# Patient Record
Sex: Female | Born: 1950 | Race: White | Hispanic: No | Marital: Single | Smoking: Former smoker
Health system: Southern US, Community
[De-identification: ages and names within clinical notes are randomized; demographics above are authoritative.]

## PROBLEM LIST (undated history)

## (undated) DIAGNOSIS — G43909 Migraine, unspecified, not intractable, without status migrainosus: Secondary | ICD-10-CM

## (undated) DIAGNOSIS — R519 Headache, unspecified: Secondary | ICD-10-CM

## (undated) DIAGNOSIS — E119 Type 2 diabetes mellitus without complications: Secondary | ICD-10-CM

## (undated) DIAGNOSIS — Z9889 Other specified postprocedural states: Secondary | ICD-10-CM

## (undated) DIAGNOSIS — R112 Nausea with vomiting, unspecified: Secondary | ICD-10-CM

## (undated) DIAGNOSIS — R011 Cardiac murmur, unspecified: Secondary | ICD-10-CM

## (undated) DIAGNOSIS — R0609 Other forms of dyspnea: Secondary | ICD-10-CM

## (undated) DIAGNOSIS — I77810 Thoracic aortic ectasia: Secondary | ICD-10-CM

## (undated) DIAGNOSIS — K219 Gastro-esophageal reflux disease without esophagitis: Secondary | ICD-10-CM

## (undated) DIAGNOSIS — F32A Depression, unspecified: Secondary | ICD-10-CM

## (undated) DIAGNOSIS — K2270 Barrett's esophagus without dysplasia: Secondary | ICD-10-CM

## (undated) DIAGNOSIS — G4733 Obstructive sleep apnea (adult) (pediatric): Secondary | ICD-10-CM

## (undated) DIAGNOSIS — E785 Hyperlipidemia, unspecified: Secondary | ICD-10-CM

## (undated) DIAGNOSIS — K259 Gastric ulcer, unspecified as acute or chronic, without hemorrhage or perforation: Secondary | ICD-10-CM

## (undated) DIAGNOSIS — R51 Headache: Secondary | ICD-10-CM

## (undated) DIAGNOSIS — R0600 Dyspnea, unspecified: Secondary | ICD-10-CM

## (undated) DIAGNOSIS — M1712 Unilateral primary osteoarthritis, left knee: Secondary | ICD-10-CM

## (undated) DIAGNOSIS — I119 Hypertensive heart disease without heart failure: Secondary | ICD-10-CM

## (undated) DIAGNOSIS — I10 Essential (primary) hypertension: Secondary | ICD-10-CM

## (undated) DIAGNOSIS — E669 Obesity, unspecified: Secondary | ICD-10-CM

## (undated) HISTORY — PX: KNEE ARTHROSCOPY: SHX127

## (undated) HISTORY — PX: APPENDECTOMY: SHX54

## (undated) HISTORY — PX: NASAL SINUS SURGERY: SHX719

## (undated) HISTORY — PX: CHOLECYSTECTOMY: SHX55

## (undated) HISTORY — PX: ABDOMINAL HYSTERECTOMY: SHX81

## (undated) HISTORY — PX: COLONOSCOPY: SHX174

## (undated) HISTORY — PX: HERNIA REPAIR: SHX51

## (undated) HISTORY — PX: BACK SURGERY: SHX140

---

## 2007-10-31 ENCOUNTER — Ambulatory Visit: Payer: Self-pay

## 2008-11-25 ENCOUNTER — Ambulatory Visit: Payer: Self-pay | Admitting: Unknown Physician Specialty

## 2008-11-30 ENCOUNTER — Ambulatory Visit: Payer: Self-pay | Admitting: Unknown Physician Specialty

## 2011-11-23 ENCOUNTER — Ambulatory Visit: Payer: Self-pay | Admitting: Family Medicine

## 2014-06-06 ENCOUNTER — Ambulatory Visit
Admit: 2014-06-06 | Disposition: A | Discharge status: Home / Self Care | Payer: Self-pay | Attending: Surgery | Admitting: Surgery

## 2014-06-29 ENCOUNTER — Encounter
Admission: RE | Admit: 2014-06-29 | Discharge: 2014-06-29 | Disposition: A | Discharge status: Home / Self Care | Payer: Medicare HMO | Source: Ambulatory Visit | Attending: Family Medicine | Admitting: Family Medicine

## 2014-06-29 ENCOUNTER — Other Ambulatory Visit: Payer: Self-pay

## 2014-06-29 DIAGNOSIS — Z79899 Other long term (current) drug therapy: Secondary | ICD-10-CM | POA: Diagnosis not present

## 2014-06-29 DIAGNOSIS — Z91013 Allergy to seafood: Secondary | ICD-10-CM | POA: Diagnosis not present

## 2014-06-29 DIAGNOSIS — Z9049 Acquired absence of other specified parts of digestive tract: Secondary | ICD-10-CM | POA: Diagnosis not present

## 2014-06-29 DIAGNOSIS — K219 Gastro-esophageal reflux disease without esophagitis: Secondary | ICD-10-CM | POA: Diagnosis not present

## 2014-06-29 DIAGNOSIS — M2391 Unspecified internal derangement of right knee: Secondary | ICD-10-CM | POA: Diagnosis not present

## 2014-06-29 DIAGNOSIS — I10 Essential (primary) hypertension: Secondary | ICD-10-CM | POA: Diagnosis not present

## 2014-06-29 DIAGNOSIS — M1711 Unilateral primary osteoarthritis, right knee: Secondary | ICD-10-CM | POA: Diagnosis not present

## 2014-06-29 DIAGNOSIS — Z803 Family history of malignant neoplasm of breast: Secondary | ICD-10-CM | POA: Diagnosis not present

## 2014-06-29 DIAGNOSIS — M179 Osteoarthritis of knee, unspecified: Secondary | ICD-10-CM | POA: Diagnosis not present

## 2014-06-29 DIAGNOSIS — Z833 Family history of diabetes mellitus: Secondary | ICD-10-CM | POA: Diagnosis not present

## 2014-06-29 DIAGNOSIS — G43909 Migraine, unspecified, not intractable, without status migrainosus: Secondary | ICD-10-CM | POA: Diagnosis not present

## 2014-06-29 DIAGNOSIS — Z01812 Encounter for preprocedural laboratory examination: Secondary | ICD-10-CM | POA: Diagnosis not present

## 2014-06-29 DIAGNOSIS — Z9889 Other specified postprocedural states: Secondary | ICD-10-CM | POA: Diagnosis not present

## 2014-06-29 DIAGNOSIS — E119 Type 2 diabetes mellitus without complications: Secondary | ICD-10-CM | POA: Diagnosis not present

## 2014-06-29 DIAGNOSIS — Z808 Family history of malignant neoplasm of other organs or systems: Secondary | ICD-10-CM | POA: Diagnosis not present

## 2014-06-29 DIAGNOSIS — Z87891 Personal history of nicotine dependence: Secondary | ICD-10-CM | POA: Diagnosis not present

## 2014-06-29 DIAGNOSIS — R011 Cardiac murmur, unspecified: Secondary | ICD-10-CM | POA: Diagnosis not present

## 2014-06-29 DIAGNOSIS — Z885 Allergy status to narcotic agent status: Secondary | ICD-10-CM | POA: Diagnosis not present

## 2014-06-29 DIAGNOSIS — Z882 Allergy status to sulfonamides status: Secondary | ICD-10-CM | POA: Diagnosis not present

## 2014-06-29 DIAGNOSIS — G8929 Other chronic pain: Secondary | ICD-10-CM | POA: Diagnosis not present

## 2014-06-29 DIAGNOSIS — Z886 Allergy status to analgesic agent status: Secondary | ICD-10-CM | POA: Diagnosis not present

## 2014-06-29 HISTORY — DX: Headache, unspecified: R51.9

## 2014-06-29 HISTORY — DX: Other specified postprocedural states: Z98.890

## 2014-06-29 HISTORY — DX: Headache: R51

## 2014-06-29 HISTORY — DX: Gastro-esophageal reflux disease without esophagitis: K21.9

## 2014-06-29 HISTORY — DX: Essential (primary) hypertension: I10

## 2014-06-29 HISTORY — DX: Gastric ulcer, unspecified as acute or chronic, without hemorrhage or perforation: K25.9

## 2014-06-29 HISTORY — DX: Type 2 diabetes mellitus without complications: E11.9

## 2014-06-29 HISTORY — DX: Nausea with vomiting, unspecified: R11.2

## 2014-06-29 LAB — BASIC METABOLIC PANEL
Anion gap: 7 (ref 5–15)
BUN: 11 mg/dL (ref 6–20)
CO2: 22 mmol/L (ref 22–32)
Calcium: 9.2 mg/dL (ref 8.9–10.3)
Chloride: 112 mmol/L — ABNORMAL HIGH (ref 101–111)
Creatinine, Ser: 0.96 mg/dL (ref 0.44–1.00)
GFR calc Af Amer: >60 mL/min (ref >60)
Glucose, Bld: 137 mg/dL — ABNORMAL HIGH (ref 65–99)
Potassium: 4.2 mmol/L (ref 3.5–5.1)
Sodium: 141 mmol/L (ref 135–145)

## 2014-06-29 NOTE — Pre-Procedure Instructions (Signed)
Cindy Singleton  06/29/2014   Your procedure is scheduled on:  07/07/14  Report to Same Day Surgery, [facility] Entrance   Call this number [phone] the day before surgery to get your arrival time for 07/07/14   Remember:   Do not eat food or drink liquids after midnight.   Take these medicines the morning of surgery with A SIP OF WATER: carvedilol, omeprazole, cetirizine. Hold your metformin for 2 days prior to surgery   Do not wear jewelry, make-up or nail polish.  Do not wear lotions, powders, or perfumes. You may wear deodorant.  Do not shave 48 hours prior to surgery. Men may shave face and neck.  Do not bring valuables to the hospital.  [facility] not [facility]                  for any belongings or valuables.               Contacts, dentures or bridgework may not be worn into surgery.  Leave suitcase in the car. After surgery it may be brought to your room.  For patients admitted to the hospital, discharge time is determined by your                treatment team.               Patients discharged the day of surgery will not be allowed to drive  home.  Name and phone number of your driver:  Special Instructions: Shower using CHG 2 nights before surgery and the night before surgery.  If you shower the day of surgery use CHG.  Use special wash - you have one bottle of CHG for all showers.  You should use approximately 1/3 of the bottle for each shower.   Please read over the following fact sheets that you were given: Surgical Site Infection Prevention

## 2014-07-06 MED ORDER — DEXTROSE 5 % IV SOLN
3.0000 g | INTRAVENOUS | Status: AC
  Administered 2014-07-07: 3 g via INTRAVENOUS
  Filled 2014-07-06 (×2): qty 3000

## 2014-07-07 ENCOUNTER — Ambulatory Visit: Payer: Medicare HMO | Admitting: Certified Registered Nurse Anesthetist

## 2014-07-07 ENCOUNTER — Encounter
Admission: RE | Disposition: A | Discharge status: Home / Self Care | Payer: Self-pay | Source: Ambulatory Visit | Attending: Surgery

## 2014-07-07 ENCOUNTER — Ambulatory Visit
Admission: RE | Admit: 2014-07-07 | Discharge: 2014-07-07 | Disposition: A | Discharge status: Home / Self Care | Payer: Medicare HMO | Source: Ambulatory Visit | Attending: Surgery | Admitting: Surgery

## 2014-07-07 ENCOUNTER — Encounter: Payer: Self-pay | Admitting: Anesthesiology

## 2014-07-07 DIAGNOSIS — Z885 Allergy status to narcotic agent status: Secondary | ICD-10-CM | POA: Insufficient documentation

## 2014-07-07 DIAGNOSIS — Z833 Family history of diabetes mellitus: Secondary | ICD-10-CM | POA: Insufficient documentation

## 2014-07-07 DIAGNOSIS — Z882 Allergy status to sulfonamides status: Secondary | ICD-10-CM | POA: Insufficient documentation

## 2014-07-07 DIAGNOSIS — M1711 Unilateral primary osteoarthritis, right knee: Secondary | ICD-10-CM | POA: Insufficient documentation

## 2014-07-07 DIAGNOSIS — Z808 Family history of malignant neoplasm of other organs or systems: Secondary | ICD-10-CM | POA: Insufficient documentation

## 2014-07-07 DIAGNOSIS — G8929 Other chronic pain: Secondary | ICD-10-CM | POA: Insufficient documentation

## 2014-07-07 DIAGNOSIS — Z87891 Personal history of nicotine dependence: Secondary | ICD-10-CM | POA: Insufficient documentation

## 2014-07-07 DIAGNOSIS — I10 Essential (primary) hypertension: Secondary | ICD-10-CM | POA: Insufficient documentation

## 2014-07-07 DIAGNOSIS — M2391 Unspecified internal derangement of right knee: Secondary | ICD-10-CM | POA: Insufficient documentation

## 2014-07-07 DIAGNOSIS — Z9049 Acquired absence of other specified parts of digestive tract: Secondary | ICD-10-CM | POA: Insufficient documentation

## 2014-07-07 DIAGNOSIS — Z886 Allergy status to analgesic agent status: Secondary | ICD-10-CM | POA: Insufficient documentation

## 2014-07-07 DIAGNOSIS — K219 Gastro-esophageal reflux disease without esophagitis: Secondary | ICD-10-CM | POA: Insufficient documentation

## 2014-07-07 DIAGNOSIS — R011 Cardiac murmur, unspecified: Secondary | ICD-10-CM | POA: Insufficient documentation

## 2014-07-07 DIAGNOSIS — Z79899 Other long term (current) drug therapy: Secondary | ICD-10-CM | POA: Insufficient documentation

## 2014-07-07 DIAGNOSIS — Z9889 Other specified postprocedural states: Secondary | ICD-10-CM | POA: Insufficient documentation

## 2014-07-07 DIAGNOSIS — G43909 Migraine, unspecified, not intractable, without status migrainosus: Secondary | ICD-10-CM | POA: Insufficient documentation

## 2014-07-07 DIAGNOSIS — E119 Type 2 diabetes mellitus without complications: Secondary | ICD-10-CM | POA: Insufficient documentation

## 2014-07-07 DIAGNOSIS — Z91013 Allergy to seafood: Secondary | ICD-10-CM | POA: Insufficient documentation

## 2014-07-07 DIAGNOSIS — Z803 Family history of malignant neoplasm of breast: Secondary | ICD-10-CM | POA: Insufficient documentation

## 2014-07-07 HISTORY — PX: KNEE ARTHROSCOPY WITH MEDIAL MENISECTOMY: SHX5651

## 2014-07-07 LAB — GLUCOSE, CAPILLARY
Glucose-Capillary: 158 mg/dL — ABNORMAL HIGH (ref 70–99)
Glucose-Capillary: 150 mg/dL — ABNORMAL HIGH (ref 70–99)

## 2014-07-07 SURGERY — ARTHROSCOPY, KNEE, WITH MEDIAL MENISCECTOMY
Anesthesia: General | Laterality: Right

## 2014-07-07 MED ORDER — LIDOCAINE HCL 1 % IJ SOLN
INTRAMUSCULAR | Status: DC | PRN
  Administered 2014-07-07: 30 mL via INTRADERMAL

## 2014-07-07 MED ORDER — BUPIVACAINE-EPINEPHRINE (PF) 0.5% -1:200000 IJ SOLN
INTRAMUSCULAR | Status: AC
  Filled 2014-07-07: qty 30

## 2014-07-07 MED ORDER — ONDANSETRON HCL 4 MG/2ML IJ SOLN
INTRAMUSCULAR | Status: DC | PRN
  Administered 2014-07-07: 4 mg via INTRAVENOUS

## 2014-07-07 MED ORDER — LIDOCAINE HCL (CARDIAC) 20 MG/ML IV SOLN
INTRAVENOUS | Status: DC | PRN
  Administered 2014-07-07: 100 mg via INTRAVENOUS

## 2014-07-07 MED ORDER — BUPIVACAINE-EPINEPHRINE (PF) 0.5% -1:200000 IJ SOLN
INTRAMUSCULAR | Status: DC | PRN
  Administered 2014-07-07: 30 mL
  Administered 2014-07-07: 14 mL

## 2014-07-07 MED ORDER — SUCCINYLCHOLINE CHLORIDE 20 MG/ML IJ SOLN
INTRAMUSCULAR | Status: DC | PRN
  Administered 2014-07-07: 120 mg via INTRAVENOUS

## 2014-07-07 MED ORDER — ONDANSETRON HCL 4 MG/2ML IJ SOLN
4.0000 mg | Freq: Once | INTRAMUSCULAR | Status: AC | PRN
  Administered 2014-07-07: 4 mg via INTRAVENOUS

## 2014-07-07 MED ORDER — ONDANSETRON HCL 4 MG/2ML IJ SOLN
INTRAMUSCULAR | Status: AC
  Administered 2014-07-07: 4 mg via INTRAVENOUS
  Filled 2014-07-07: qty 2

## 2014-07-07 MED ORDER — LIDOCAINE HCL (PF) 1 % IJ SOLN
INTRAMUSCULAR | Status: AC
  Filled 2014-07-07: qty 30

## 2014-07-07 MED ORDER — FENTANYL CITRATE (PF) 100 MCG/2ML IJ SOLN: 25.0000 ug | INTRAMUSCULAR | Status: DC | PRN

## 2014-07-07 MED ORDER — ACETAMINOPHEN 10 MG/ML IV SOLN
INTRAVENOUS | Status: AC
  Administered 2014-07-07: 1000 mg via INTRAVENOUS
  Filled 2014-07-07: qty 100

## 2014-07-07 MED ORDER — MIDAZOLAM HCL 2 MG/2ML IJ SOLN
INTRAMUSCULAR | Status: DC | PRN
  Administered 2014-07-07: 2 mg via INTRAVENOUS

## 2014-07-07 MED ORDER — FENTANYL CITRATE (PF) 100 MCG/2ML IJ SOLN
INTRAMUSCULAR | Status: DC | PRN
  Administered 2014-07-07 (×2): 50 ug via INTRAVENOUS

## 2014-07-07 MED ORDER — SODIUM CHLORIDE 0.9 % IV SOLN
INTRAVENOUS | Status: DC
  Administered 2014-07-07 (×2): via INTRAVENOUS

## 2014-07-07 MED ORDER — CHLORHEXIDINE GLUCONATE 4 % EX LIQD
60.0000 mL | Freq: Once | CUTANEOUS | Status: AC
  Administered 2014-07-07: 4 via TOPICAL

## 2014-07-07 MED ORDER — PROPOFOL 10 MG/ML IV BOLUS
INTRAVENOUS | Status: DC | PRN
  Administered 2014-07-07: 160 mg via INTRAVENOUS

## 2014-07-07 SURGICAL SUPPLY — 28 items
BAG COUNTER SPONGE EZ (MISCELLANEOUS) IMPLANT
BANDAGE ELASTIC 6 CLIP ST LF (GAUZE/BANDAGES/DRESSINGS) ×3 IMPLANT
BLADE FULL RADIUS 3.5 (BLADE) ×3 IMPLANT
BLADE SHAVER 4.5X7 STR FR (MISCELLANEOUS) ×6 IMPLANT
CHLORAPREP W/TINT 26ML (MISCELLANEOUS) ×3 IMPLANT
COUNTER SPONGE BAG EZ (MISCELLANEOUS)
GAUZE SPONGE 4X4 12PLY STRL (GAUZE/BANDAGES/DRESSINGS) ×3 IMPLANT
GLOVE BIO SURGEON STRL SZ8 (GLOVE) ×6 IMPLANT
GLOVE BIOGEL M 7.0 STRL (GLOVE) ×6 IMPLANT
GLOVE BIOGEL PI IND STRL 7.5 (GLOVE) ×1 IMPLANT
GLOVE BIOGEL PI INDICATOR 7.5 (GLOVE) ×2
GLOVE INDICATOR 8.0 STRL GRN (GLOVE) ×3 IMPLANT
GOWN STRL REUS W/ TWL LRG LVL3 (GOWN DISPOSABLE) ×1 IMPLANT
GOWN STRL REUS W/ TWL XL LVL3 (GOWN DISPOSABLE) ×2 IMPLANT
GOWN STRL REUS W/TWL LRG LVL3 (GOWN DISPOSABLE) ×2
GOWN STRL REUS W/TWL XL LVL3 (GOWN DISPOSABLE) ×4
IV LACTATED RINGER IRRG 3000ML (IV SOLUTION) ×2
IV LR IRRIG 3000ML ARTHROMATIC (IV SOLUTION) ×1 IMPLANT
KIT RM TURNOVER STRD PROC AR (KITS) ×3 IMPLANT
MANIFOLD NEPTUNE II (INSTRUMENTS) ×3 IMPLANT
NEEDLE HYPO 21X1.5 SAFETY (NEEDLE) ×3 IMPLANT
PACK ARTHROSCOPY KNEE (MISCELLANEOUS) ×3 IMPLANT
PAD CAST CTTN 4X4 STRL (SOFTGOODS) ×1 IMPLANT
PADDING CAST COTTON 4X4 STRL (SOFTGOODS) ×2
SUT PROLENE 4 0 PS 2 18 (SUTURE) ×3 IMPLANT
SYR 50ML LL SCALE MARK (SYRINGE) ×3 IMPLANT
TUBING ARTHRO INFLOW-ONLY STRL (TUBING) ×3 IMPLANT
WAND HAND CNTRL MULTIVAC 90 (MISCELLANEOUS) ×3 IMPLANT

## 2014-07-07 NOTE — Anesthesia Procedure Notes (Signed)
Procedure Name: Intubation Performed by: Malva CoganBEANE, Shandi Godfrey Patient Re-evaluated:Patient Re-evaluated prior to inductionOxygen Delivery Method: Circle system utilized Preoxygenation: Pre-oxygenation with 100% oxygen Intubation Type: IV induction, Rapid sequence and Cricoid Pressure applied Ventilation: Mask ventilation without difficulty Laryngoscope Size: Mac and 3 Grade View: Grade I Tube type: Oral Number of attempts: 1 Placement Confirmation: ETT inserted through vocal cords under direct vision,  positive ETCO2 and breath sounds checked- equal and bilateral Secured at: 21 cm Tube secured with: Tape Dental Injury: Teeth and Oropharynx as per pre-operative assessment

## 2014-07-07 NOTE — Anesthesia Preprocedure Evaluation (Deleted)
Anesthesia Evaluation    Airway        Dental   Pulmonary former smoker,           Cardiovascular hypertension,      Neuro/Psych    GI/Hepatic   Endo/Other  diabetes  Renal/GU      Musculoskeletal   Abdominal   Peds  Hematology   Anesthesia Other Findings   Reproductive/Obstetrics                             Anesthesia Physical Anesthesia Plan Anesthesia Quick Evaluation  

## 2014-07-07 NOTE — Anesthesia Preprocedure Evaluation (Addendum)
Anesthesia Evaluation  Patient identified by MRN, date of birth, ID band Patient awake    Reviewed: Allergy & Precautions, NPO status , Patient's Chart, lab work & pertinent test results, reviewed documented beta blocker date and time   History of Anesthesia Complications (+) history of anesthetic complications  Airway Mallampati: II   Neck ROM: Full    Dental  (+) Dental Advisory Given, Partial Upper   Pulmonary former smoker,  breath sounds clear to auscultation  Pulmonary exam normal       Cardiovascular hypertension, Rhythm:Regular Rate:Normal     Neuro/Psych  Headaches,    GI/Hepatic PUD, GERD-  Controlled and Medicated,  Endo/Other  diabetes, Well Controlled, Type 2  Renal/GU   negative genitourinary   Musculoskeletal negative musculoskeletal ROS (+)   Abdominal Normal abdominal exam  (+) + obese,   Peds negative pediatric ROS (+)  Hematology   Anesthesia Other Findings   Reproductive/Obstetrics negative OB ROS                            Anesthesia Physical Anesthesia Plan  ASA: III  Anesthesia Plan: General   Post-op Pain Management:    Induction: Intravenous and Rapid sequence  Airway Management Planned: Oral ETT  Additional Equipment:   Intra-op Plan:   Post-operative Plan: Extubation in OR  Informed Consent: I have reviewed the patients History and Physical, chart, labs and discussed the procedure including the risks, benefits and alternatives for the proposed anesthesia with the patient or authorized representative who has indicated his/her understanding and acceptance.   Dental advisory given  Plan Discussed with: CRNA and Surgeon  Anesthesia Plan Comments:         Anesthesia Quick Evaluation

## 2014-07-07 NOTE — Discharge Instructions (Addendum)
Keep dressing dry and intact.  May shower after dressing changed on post-op day #4 (Saturday).  Cover staples/sutures with Band-Aids after drying off. Apply ice frequently to knee. May weight-bear as tolerated - use crutches or walker as needed. Follow-up in 10-14 days or as scheduled.Arthroscopic Procedure, Knee, Care After Refer to this sheet in the next few weeks. These discharge instructions provide you with general information on caring for yourself after you leave the hospital. Your health care provider may also give you specific instructions. Your treatment has been planned according to the most current medical practices available, but unavoidable complications sometimes occur. If you have any problems or questions after discharge, please call your health care provider. HOME CARE INSTRUCTIONS   It is normal to be sore for a couple days after surgery. See your health care provider if this seems to be getting worse rather than better.  Only take over-the-counter or prescription medicines for pain, discomfort, or fever as directed by your health care provider.  Take showers rather than baths, or as directed by your health care provider.  Change bandages (dressings) if necessary or as directed.  You may resume normal diet and activities as directed or allowed.  Avoid lifting and driving until you are directed otherwise.  Make an appointment to see your health care provider for stitches (suture) or staple removal as directed.  You may put ice on the area.  Put the ice in a plastic bag. Place a towel between your skin and the bag.  Leave the ice on for 15-20 minutes, three to four times per day for the first 2 days.  Elevate the knee above the level of your heart to reduce swelling, and avoid dangling the leg.  Do 10-15 ankle pumps (pointing your toes toward you and then away from you) two to three times daily.  If you are given compression stockings to wear after surgery, use them  for as long as your surgeon tells you (around 10-14 days).  Avoid smoking and exposure to secondhand smoke. SEEK MEDICAL CARE IF:   You have increased bleeding from your wounds.  You see redness or swelling or you have increasing pain in your wounds.  You have pus coming from your wound.  You have a fever or persistent symptoms for more than 2-3 days.  You notice a bad smell coming from the wound or dressing.  You have severe pain with any motion of your knee. SEEK IMMEDIATE MEDICAL CARE IF:   You develop a rash.  You have difficulty breathing.  You develop any reaction or side effects to medicines taken.  You develop pain in the calves or back of the knee.  You develop chest pain, shortness of breath, or difficulty breathing.  You develop numbness or tingling in the leg or foot. MAKE SURE YOU:   Understand these instructions.  Will watch your condition.  Will get help right away if you are not doing well or you get worse. Document Released: 09/02/2004 Document Revised: 02/18/2013 Document Reviewed: 07/11/2012 [facility] Patient Information 2015 [place], [place]. This information is not intended to replace advice given to you by your health care provider. Make sure you discuss any questions you have with your health care provider. General Anesthesia, Care After Refer to this sheet in the next few weeks. These instructions provide you with information on caring for yourself after your procedure. Your health care provider may also give you more specific instructions. Your treatment has been planned according to current medical  practices, but problems sometimes occur. Call your health care provider if you have any problems or questions after your procedure. WHAT TO EXPECT AFTER THE PROCEDURE After the procedure, it is typical to experience:  Sleepiness.  Nausea and vomiting. HOME CARE INSTRUCTIONS  For the first 24 hours after general anesthesia:  Have a responsible person  with you.  Do not drive a car. If you are alone, do not take public transportation.  Do not drink alcohol.  Do not take medicine that has not been prescribed by your health care provider.  Do not sign important papers or make important decisions.  You may resume a normal diet and activities as directed by your health care provider.  Change bandages (dressings) as directed.  If you have questions or problems that seem related to general anesthesia, call the hospital and ask for the anesthetist or anesthesiologist on call. SEEK MEDICAL CARE IF:  You have nausea and vomiting that continue the day after anesthesia.  You develop a rash. SEEK IMMEDIATE MEDICAL CARE IF:   You have difficulty breathing.  You have chest pain.  You have any allergic problems. Document Released: 05/22/2000 Document Revised: 02/18/2013 Document Reviewed: 08/29/2012 [facility] Patient Information 2015 [place], [place]. This information is not intended to replace advice given to you by your health care provider. Make sure you discuss any questions you have with your health care provider.

## 2014-07-07 NOTE — Anesthesia Postprocedure Evaluation (Signed)
  Anesthesia Post-op Note  Patient: Cindy CordialNancy Jo Singleton  Procedure(s) Performed: Procedure(s): KNEE ARTHROSCOPY WITH MEDIAL MENISECTOMY  Right Arthroscopic debridement and partial medial menisectomy (Right)  Anesthesia type:General  Patient location: PACU  Post pain: Pain level controlled  Post assessment: Post-op Vital signs reviewed, Patient's Cardiovascular Status Stable, Respiratory Function Stable, Patent Airway and No signs of Nausea or vomiting  Post vital signs: Reviewed and stable  Last Vitals:  Filed Vitals:   07/07/14 1339  BP: 171/83  Pulse: 68  Temp:   Resp: 14    Level of consciousness: awake, alert  and patient cooperative  Complications: No apparent anesthesia complications

## 2014-07-07 NOTE — Op Note (Signed)
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  Postoperative diagnosis:   Same  Procedure:   Arthroscopic partial medial meniscectomy and extensive arthroscopic debridement right knee.  Surgeon:   Maryagnes AmosJ. Jeffrey Poggi, M.D.  Anesthesia:   General endotracheal.  Findings:   As above. The lateral meniscus was in satisfactory condition as were the anterior and posterior cruciate ligaments. There was significant tricompartmental degenerative changes with grade 3 chondral malacia changes involving the femoral trochlea and medial femoral condyle, and grade 2 chondromalacial changes involving the medial tibial plateau, the patella, and the lateral femoral condyle.  Complications:   None.  EBL:   <5 cc.  Total fluids:   600 cc of crystalloid.  Tourniquet time:   None  Drains:   None  Closure:   4-0 Prolene interrupted sutures.  Brief clinical note:   The patient is is a significantly overweight 64 year old female with a several month history of progressively worsening medial sided right knee pain. Her symptoms have progressed despite medications, activity modification, etc. Her history and examination were consistent with moderate degenerative joint disease with a degenerative medial meniscus tear, both of which were confirmed by MRI scan. The patient presents at this time for arthroscopy, debridement, and partial lateral meniscectomy.  Procedure:   The patient was brought into the operating room and lain in the supine position. After adequate general endotracheal intubation and anesthesia was obtained, a timeout was performed to verify the appropriate side. The patient's right knee was injected sterilely using a solution of 30 cc of 1% lidocaine and 30 cc of 0.5% Sensorcaine with epinephrine. The right lower extremity was prepped with DuraPrep solution before being draped sterilely. Preoperative antibiotics were  administered. The expected portal sites were injected with 0.5% Sensorcaine without for the camera was placed in the anterolateral portal and instrumentation performed through the anteromedial portal. The knee was sequentially examined beginning in the suprapatellar pouch the progressing to the patellofemoral space, the medial gutter compartment, the notch, and finally the lateral compartment entered. The findings were as described above. Abundant reactive synovial tissues anteriorly were debrided using the full-radius resector in order to improve visualization. The degenerative medial meniscus tear was debrided back to stable margins using a combination of the straight and up-biting baskets and full-radius resector. Some probing of the remaining rim demonstrated good stability. The areas of diffuse grade 2 and 3 chondromalacia involving the medial femoral condyle, the femoral trochlea, and the lateral femoral condyle were debrided back to stable margins using a full-radius resector. An osteophyte in the notch also was debrided using the full-radius resector. The ArthroCare wand was used to help obtain hemostasis before the instruments were removed from the joint after suctioning the excess fluid. The portal sites were closed using 4-0 Prolene interrupted sutures before a sterile bulky dressing was applied to the knee. The patient was then awakened, extubated, and returned to the recovery room in satisfactory condition after tolerating the procedure well.

## 2014-07-07 NOTE — Transfer of Care (Signed)
Immediate Anesthesia Transfer of Care Note  Patient: Cindy CordialNancy Jo Singleton  Procedure(s) Performed: Procedure(s): KNEE ARTHROSCOPY WITH MEDIAL MENISECTOMY  Right Arthroscopic debridement and partial medial menisectomy (Right)  Patient Location: PACU  Anesthesia Type:General  Level of Consciousness: awake, alert  and oriented  Airway & Oxygen Therapy: Patient Spontanous Breathing and Patient connected to face mask oxygen  Post-op Assessment: Report given to RN and Post -op Vital signs reviewed and stable  Post vital signs: Reviewed and stable  Last Vitals:  Filed Vitals:   07/07/14 1326  BP: 173/93  Pulse: 70  Temp: 36.4 C  Resp: 14    Complications: No apparent anesthesia complications

## 2014-07-09 ENCOUNTER — Encounter: Payer: Self-pay | Admitting: Surgery

## 2014-11-23 ENCOUNTER — Emergency Department
Admission: EM | Admit: 2014-11-23 | Discharge: 2014-11-23 | Disposition: A | Discharge status: Home / Self Care | Payer: Medicare HMO | Attending: Emergency Medicine | Admitting: Emergency Medicine

## 2014-11-23 ENCOUNTER — Emergency Department: Payer: Medicare HMO

## 2014-11-23 DIAGNOSIS — E119 Type 2 diabetes mellitus without complications: Secondary | ICD-10-CM | POA: Insufficient documentation

## 2014-11-23 DIAGNOSIS — Z79899 Other long term (current) drug therapy: Secondary | ICD-10-CM | POA: Insufficient documentation

## 2014-11-23 DIAGNOSIS — Z87891 Personal history of nicotine dependence: Secondary | ICD-10-CM | POA: Diagnosis not present

## 2014-11-23 DIAGNOSIS — I10 Essential (primary) hypertension: Secondary | ICD-10-CM | POA: Insufficient documentation

## 2014-11-23 DIAGNOSIS — R0602 Shortness of breath: Secondary | ICD-10-CM | POA: Diagnosis present

## 2014-11-23 DIAGNOSIS — M25461 Effusion, right knee: Secondary | ICD-10-CM | POA: Insufficient documentation

## 2014-11-23 DIAGNOSIS — R0609 Other forms of dyspnea: Secondary | ICD-10-CM | POA: Diagnosis not present

## 2014-11-23 LAB — COMPREHENSIVE METABOLIC PANEL
ALBUMIN: 3.7 g/dL (ref 3.5–5.0)
ALK PHOS: 81 U/L (ref 38–126)
ALT: 21 U/L (ref 14–54)
AST: 20 U/L (ref 15–41)
Anion gap: 9 (ref 5–15)
BUN: 10 mg/dL (ref 6–20)
CALCIUM: 9.3 mg/dL (ref 8.9–10.3)
CO2: 21 mmol/L — AB (ref 22–32)
CREATININE: 0.88 mg/dL (ref 0.44–1.00)
Chloride: 110 mmol/L (ref 101–111)
GFR calc Af Amer: >60 mL/min (ref >60)
GFR calc non Af Amer: >60 mL/min (ref >60)
Glucose, Bld: 113 mg/dL — ABNORMAL HIGH (ref 65–99)
Potassium: 3.5 mmol/L (ref 3.5–5.1)
SODIUM: 140 mmol/L (ref 135–145)
Total Bilirubin: 0.5 mg/dL (ref 0.3–1.2)
Total Protein: 6.6 g/dL (ref 6.5–8.1)

## 2014-11-23 LAB — CBC
HCT: 37.4 % (ref 35.0–47.0)
HEMOGLOBIN: 12.4 g/dL (ref 12.0–16.0)
MCH: 28.5 pg (ref 26.0–34.0)
MCHC: 33.1 g/dL (ref 32.0–36.0)
MCV: 86 fL (ref 80.0–100.0)
Platelets: 280 10*3/uL (ref 150–440)
RBC: 4.34 MIL/uL (ref 3.80–5.20)
RDW: 14.4 % (ref 11.5–14.5)
WBC: 10.1 10*3/uL (ref 3.6–11.0)

## 2014-11-23 LAB — TROPONIN I: Troponin I: <0.03 ng/mL (ref <0.031)

## 2014-11-23 LAB — FIBRIN DERIVATIVES D-DIMER (ARMC ONLY): Fibrin derivatives D-dimer (ARMC): 517 — ABNORMAL HIGH (ref 0–499)

## 2014-11-23 NOTE — ED Notes (Signed)

## 2014-11-23 NOTE — ED Provider Notes (Signed)
Time Seen: Approximately 1920  I have reviewed the triage notes  Chief Complaint: Shortness of Breath   History of Present Illness: Cindy Singleton is a 64 y.o. female who states that she's been having shortness of breath and some chest pressure with ambulation now for "" couple of years "". Her family members with her states it has seemingly been worse recently. Patient mentioned this when she was at a routine visit with her primary physician today. She apparently had some hypertension at the location and was referred to the emergency department by ems for evaluation. Patient denies any shortness of breath or chest pressure at this time. She states she's only noticed it not with activity. She has noticed some mild increased swelling in both of her feet. She denies any calf tenderness or persistent shortness of breath. She denies any nausea, vomiting, fever or chills or productive cough or any other concerns.   Past Medical History  Diagnosis Date  . PONV (postoperative nausea and vomiting)   . Hypertension   . Diabetes mellitus without complication   . GERD (gastroesophageal reflux disease)   . Headache     migraines  . Stomach ulcer     There are no active problems to display for this patient.   Past Surgical History  Procedure Laterality Date  . Appendectomy    . Abdominal hysterectomy    . Back surgery    . Hernia repair    . Cholecystectomy    . Knee arthroscopy with medial menisectomy Right 07/07/2014    Procedure: KNEE ARTHROSCOPY WITH MEDIAL MENISECTOMY  Right Arthroscopic debridement and partial medial menisectomy;  Surgeon: Christena Flake, MD;  Location: [facility] ORS;  Service: Orthopedics;  Laterality: Right;    Past Surgical History  Procedure Laterality Date  . Appendectomy    . Abdominal hysterectomy    . Back surgery    . Hernia repair    . Cholecystectomy    . Knee arthroscopy with medial menisectomy Right 07/07/2014    Procedure: KNEE ARTHROSCOPY WITH MEDIAL  MENISECTOMY  Right Arthroscopic debridement and partial medial menisectomy;  Surgeon: Christena Flake, MD;  Location: [facility] ORS;  Service: Orthopedics;  Laterality: Right;    Current Outpatient Rx  Name  Route  Sig  Dispense  Refill  . acetaminophen (TYLENOL) 325 MG tablet   Oral   Take 650 mg by mouth every 6 (six) hours as needed for mild pain, moderate pain or headache.         . carvedilol (COREG) 25 MG tablet   Oral   Take 25 mg by mouth 2 (two) times daily.      1   . cetirizine (ZYRTEC) 10 MG tablet   Oral   Take 10 mg by mouth daily.         . metFORMIN (GLUCOPHAGE) 500 MG tablet   Oral   Take 1,000 mg by mouth 2 (two) times daily.      1   . omeprazole (PRILOSEC) 20 MG capsule   Oral   Take 20 mg by mouth 2 (two) times daily before a meal.           Allergies:  Shellfish-derived products and Sulfa antibiotics  Family History: No family history on file.  Social History: Social History  Substance Use Topics  . Smoking status: Former Smoker    Quit date: 06/28/2004  . Smokeless tobacco: Never Used  . Alcohol Use: No     Review of Systems:  10 point review of systems was performed and was otherwise negative:  Constitutional: No fever Eyes: No visual disturbances ENT: No sore throat, ear pain Cardiac: No chest pain Respiratory: No shortness of breath, wheezing, or stridor Abdomen: No abdominal pain, no vomiting, No diarrhea Endocrine: No weight loss, No night sweats Extremities: Mild bilateral peripheral edema she's noticed more swelling in her right knee since her previous laparoscopic surgery, cyanosis Skin: No rashes, easy bruising Neurologic: No focal weakness, trouble with speech or swollowing Urologic: No dysuria, Hematuria, or urinary frequency   Physical Exam:  ED Triage Vitals  Enc Vitals Group     BP 11/23/14 1857 162/104 mmHg     Pulse Rate 11/23/14 1857 69     Resp --      Temp 11/23/14 1857 97.6 F (36.4 C)     Temp Source  11/23/14 1857 Oral     SpO2 11/23/14 1857 100 %     Weight 11/23/14 1857 274 lb (124.286 kg)     Height 11/23/14 1857  (1.6 m)     Head Cir --      Peak Flow --      Pain Score --      Pain Loc --      Pain Edu? --      Excl. in GC? --     General: Awake , Alert , and Oriented times 3; GCS 15 Head: Normal cephalic , atraumatic Eyes: Pupils equal , round, reactive to light Nose/Throat: No nasal drainage, patent upper airway without erythema or exudate.  Neck: Supple, Full range of motion, No anterior adenopathy or palpable thyroid masses Lungs: Clear to ascultation without wheezes , rhonchi, or rales Heart: Regular rate, regular rhythm without murmurs , gallops , or rubs Abdomen: Soft, non tender without rebound, guarding , or rigidity; bowel sounds positive and symmetric in all 4 quadrants. No organomegaly .        Extremities: 2 non-pitting 2+ symmetric peripheral edema circumferential, clubbing or cyanosis, negative Homans sign Neurologic: normal ambulation, Motor symmetric without deficits, sensory intact Skin: warm, dry, no rashes   Labs:   All laboratory work was reviewed including any pertinent negatives or positives listed below:  Labs Reviewed  CBC  COMPREHENSIVE METABOLIC PANEL  TROPONIN I  FIBRIN DERIVATIVES D-DIMER ([facility] ONLY)   review laboratory work shows only a slightly elevated D-dimer test.  EKG:  ED ECG REPORT I, Jennye Moccasin, the attending physician, personally viewed and interpreted this ECG.  Date: 11/23/2014 EKG Time: 1907 Rate: 67 Rhythm: normal sinus rhythm QRS Axis: Left ventricular hypertrophy Intervals: normal ST/T Wave abnormalities: Nonspecific ST-T wave abnormality Conduction Disutrbances: none Narrative Interpretation: unremarkable    Radiology:      EXAM: CHEST 1 VIEW  COMPARISON: None.  FINDINGS: The cardiomediastinal contours are normal. The lungs are clear. Pulmonary vasculature is normal. No consolidation, pleural  effusion, or pneumothorax. No acute osseous abnormalities are seen. Sclerotic density in the left proximal humerus, partially included.  IMPRESSION: 1. No acute pulmonary process. 2. Sclerotic density in the left proximal humerus, likely incidental. If is pain referable to the left shoulder or proximal arm, dedicated radiographs could be considered.  I personally reviewed the radiologic studies       ED Course Patient I felt did not have a life-threatening cause at this time for her presentation of dyspnea with exertion. Her D-dimer test is slightly elevated though she has no shortness of breath at rest and otherwise no clinical signs  of a pulmonary embolism. She only gets shortness of breath mainly with exertion's been going on for an extensive period of time I do agree with her primary physician that she does require further outpatient testing and made the patient aware that she'll need to contact cardiology unassigned for possible objective outpatient study. All parties agree that patient does not require hospitalization at this time. She's been advised take a baby aspirin or enteric-coated aspirin over-the-counter. She was advised to return here if she has any persistent chest pain or increasing shortness of breath especially at rest.    Assessment: Dyspnea with exertion   Final Clinical Impression: Dyspnea with exertion Final diagnoses:  None     Plan:  Patient was advised to return immediately if condition worsens. Patient was advised to follow up with her primary care physician or other specialized physicians involved and in their current assessment.            Jennye Moccasin, MD 11/23/14 [id number]

## 2014-11-23 NOTE — ED Notes (Signed)
Assisted patient up to commode in room, one person assist. Patient tolerated ambulation well. Reports does not feel increased SOB with ambulation. Patient respirations even and unlabored.

## 2014-11-23 NOTE — Discharge Instructions (Signed)
Shortness of Breath Shortness of breath means you have trouble breathing. It could also mean that you have a medical problem. You should get immediate medical care for shortness of breath. CAUSES   Not enough oxygen in the air such as with high altitudes or a smoke-filled room.  Certain lung diseases, infections, or problems.  Heart disease or conditions, such as angina or heart failure.  Low red blood cells (anemia).  Poor physical fitness, which can cause shortness of breath when you exercise.  Chest or back injuries or stiffness.  Being overweight.  Smoking.  Anxiety, which can make you feel like you are not getting enough air. DIAGNOSIS  Serious medical problems can often be found during your physical exam. Tests may also be done to determine why you are having shortness of breath. Tests may include:  Chest X-rays.  Lung function tests.  Blood tests.  An electrocardiogram (ECG).  An ambulatory electrocardiogram. An ambulatory ECG records your heartbeat patterns over a 24-hour period.  Exercise testing.  A transthoracic echocardiogram (TTE). During echocardiography, sound waves are used to evaluate how blood flows through your heart.  A transesophageal echocardiogram (TEE).  Imaging scans. Your health care provider may not be able to find a cause for your shortness of breath after your exam. In this case, it is important to have a follow-up exam with your health care provider as directed.  TREATMENT  Treatment for shortness of breath depends on the cause of your symptoms and can vary greatly. HOME CARE INSTRUCTIONS   Do not smoke. Smoking is a common cause of shortness of breath. If you smoke, ask for help to quit.  Avoid being around chemicals or things that may bother your breathing, such as paint fumes and dust.  Rest as needed. Slowly resume your usual activities.  If medicines were prescribed, take them as directed for the full length of time directed. This  includes oxygen and any inhaled medicines.  Keep all follow-up appointments as directed by your health care provider. SEEK MEDICAL CARE IF:   Your condition does not improve in the time expected.  You have a hard time doing your normal activities even with rest.  You have any new symptoms. SEEK IMMEDIATE MEDICAL CARE IF:   Your shortness of breath gets worse.  You feel light-headed, faint, or develop a cough not controlled with medicines.  You start coughing up blood.  You have pain with breathing.  You have chest pain or pain in your arms, shoulders, or abdomen.  You have a fever.  You are unable to walk up stairs or exercise the way you normally do. MAKE SURE YOU:  Understand these instructions.  Will watch your condition.  Will get help right away if you are not doing well or get worse. Document Released: 11/08/2000 Document Revised: 02/18/2013 Document Reviewed: 05/01/2011 [employer] Patient Information 2015 [place], [place]. This information is not intended to replace advice given to you by your health care provider. Make sure you discuss any questions you have with your health care provider.  Please return immediately if condition worsens. Please contact her primary physician or the physician you were given for referral. If you have any specialist physicians involved in her treatment and plan please also contact them. Thank you for using [facility]. Please take a baby aspirin were buffered aspirin per day. Please contact both your primary physician and referral cardiologist for further outpatient follow-up. Return emergency Department for persistent shortness of breath or chest discomfort  or any other new concerns.

## 2014-11-23 NOTE — ED Notes (Signed)
Pt brought in by EMS from pts doctor office in [place]. Went to [facility] to get tetanus shot. While there, pt reports telling the nurse she has been feeling SOB while moving around. Pt reports that she feels pressure like "an elephant is sitting on my chest" with mobility and feels like she is breaking out into a sweat. Pt reports feeling fine while at rest. Pt said doctor thought she may have a blockage in her heart. [facility] called EMS to bring her to [facility].

## 2015-08-12 ENCOUNTER — Other Ambulatory Visit: Payer: Self-pay | Admitting: Family Medicine

## 2015-08-12 DIAGNOSIS — Z1231 Encounter for screening mammogram for malignant neoplasm of breast: Secondary | ICD-10-CM

## 2015-08-26 ENCOUNTER — Ambulatory Visit
Admission: RE | Admit: 2015-08-26 | Discharge: 2015-08-26 | Disposition: A | Discharge status: Home / Self Care | Payer: Medicare HMO | Source: Ambulatory Visit | Attending: Family Medicine | Admitting: Family Medicine

## 2015-08-26 DIAGNOSIS — Z1231 Encounter for screening mammogram for malignant neoplasm of breast: Secondary | ICD-10-CM | POA: Diagnosis present

## 2017-03-13 ENCOUNTER — Other Ambulatory Visit: Payer: Self-pay | Admitting: Family Medicine

## 2017-03-13 DIAGNOSIS — Z1231 Encounter for screening mammogram for malignant neoplasm of breast: Secondary | ICD-10-CM

## 2017-03-20 ENCOUNTER — Ambulatory Visit
Admission: RE | Admit: 2017-03-20 | Discharge: 2017-03-20 | Disposition: A | Discharge status: Home / Self Care | Payer: Medicare HMO | Source: Ambulatory Visit | Attending: Family Medicine | Admitting: Family Medicine

## 2017-03-20 DIAGNOSIS — Z1231 Encounter for screening mammogram for malignant neoplasm of breast: Secondary | ICD-10-CM | POA: Diagnosis not present

## 2017-03-20 DIAGNOSIS — R928 Other abnormal and inconclusive findings on diagnostic imaging of breast: Secondary | ICD-10-CM | POA: Diagnosis not present

## 2017-03-22 ENCOUNTER — Other Ambulatory Visit: Payer: Self-pay | Admitting: Family Medicine

## 2017-03-22 DIAGNOSIS — N6489 Other specified disorders of breast: Secondary | ICD-10-CM

## 2017-03-22 DIAGNOSIS — R928 Other abnormal and inconclusive findings on diagnostic imaging of breast: Secondary | ICD-10-CM

## 2017-03-29 ENCOUNTER — Ambulatory Visit
Admission: RE | Admit: 2017-03-29 | Discharge: 2017-03-29 | Disposition: A | Discharge status: Home / Self Care | Payer: Medicare HMO | Source: Ambulatory Visit | Attending: Family Medicine | Admitting: Family Medicine

## 2017-03-29 DIAGNOSIS — N6489 Other specified disorders of breast: Secondary | ICD-10-CM

## 2017-03-29 DIAGNOSIS — R928 Other abnormal and inconclusive findings on diagnostic imaging of breast: Secondary | ICD-10-CM | POA: Diagnosis not present

## 2017-03-29 DIAGNOSIS — N6323 Unspecified lump in the left breast, lower outer quadrant: Secondary | ICD-10-CM | POA: Diagnosis not present

## 2017-09-04 ENCOUNTER — Other Ambulatory Visit: Payer: Self-pay | Admitting: Family Medicine

## 2017-09-04 DIAGNOSIS — N6320 Unspecified lump in the left breast, unspecified quadrant: Secondary | ICD-10-CM

## 2017-09-28 ENCOUNTER — Ambulatory Visit
Admission: RE | Admit: 2017-09-28 | Discharge: 2017-09-28 | Disposition: A | Discharge status: Home / Self Care | Payer: Medicare HMO | Source: Ambulatory Visit | Attending: Family Medicine | Admitting: Family Medicine

## 2017-09-28 DIAGNOSIS — N6320 Unspecified lump in the left breast, unspecified quadrant: Secondary | ICD-10-CM

## 2018-05-03 ENCOUNTER — Other Ambulatory Visit: Payer: Self-pay | Admitting: Cardiology

## 2018-05-03 DIAGNOSIS — Z1231 Encounter for screening mammogram for malignant neoplasm of breast: Secondary | ICD-10-CM

## 2018-05-14 ENCOUNTER — Ambulatory Visit
Admission: RE | Admit: 2018-05-14 | Discharge: 2018-05-14 | Disposition: A | Discharge status: Home / Self Care | Payer: Medicare HMO | Source: Ambulatory Visit | Attending: Cardiology | Admitting: Cardiology

## 2018-05-14 ENCOUNTER — Other Ambulatory Visit: Payer: Self-pay

## 2018-05-14 DIAGNOSIS — Z1231 Encounter for screening mammogram for malignant neoplasm of breast: Secondary | ICD-10-CM | POA: Diagnosis not present

## 2019-05-15 ENCOUNTER — Other Ambulatory Visit: Payer: Self-pay | Admitting: Medical Oncology

## 2019-05-15 DIAGNOSIS — Z1231 Encounter for screening mammogram for malignant neoplasm of breast: Secondary | ICD-10-CM

## 2019-05-19 ENCOUNTER — Ambulatory Visit: Payer: Medicare HMO

## 2019-07-30 ENCOUNTER — Other Ambulatory Visit: Payer: Self-pay

## 2019-07-30 ENCOUNTER — Ambulatory Visit
Admission: RE | Admit: 2019-07-30 | Discharge: 2019-07-30 | Disposition: A | Discharge status: Home / Self Care | Payer: Medicare HMO | Source: Ambulatory Visit | Attending: Medical Oncology | Admitting: Medical Oncology

## 2019-07-30 DIAGNOSIS — Z1231 Encounter for screening mammogram for malignant neoplasm of breast: Secondary | ICD-10-CM | POA: Insufficient documentation

## 2019-10-16 IMAGING — US US BREAST*L* LIMITED INC AXILLA
1 series · 10 of 10 positions shown · non-contrast
Comparison: Previous exam(s).

CLINICAL DATA: Follow-up of probably benign left breast 4 o'clock
nodules.

EXAM:
DIGITAL DIAGNOSTIC LEFT MAMMOGRAM WITH CAD AND TOMO
ULTRASOUND LEFT BREAST

[Series 1: us breast*left* limited inc axilla · 0.04mm/px · 10 of 10 slices shown]
[im 1/10]
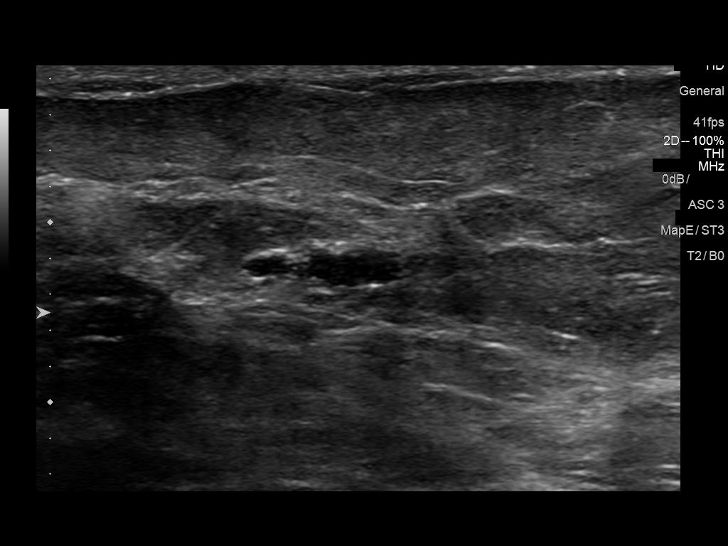
[im 2/10]
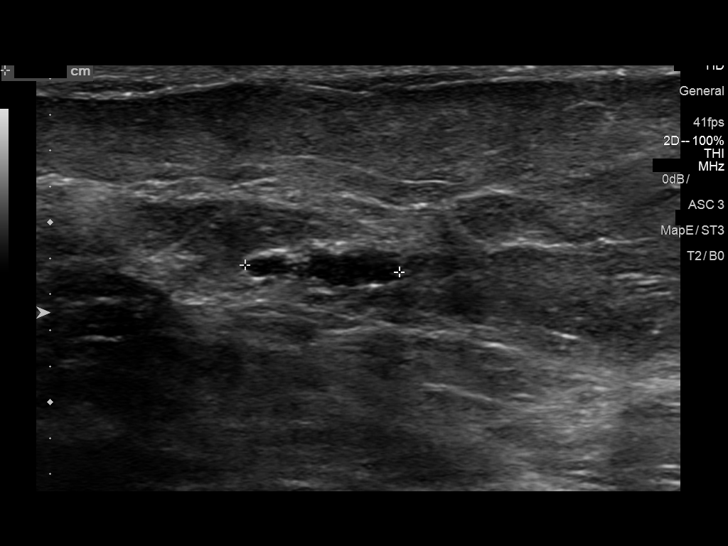
[im 3/10]
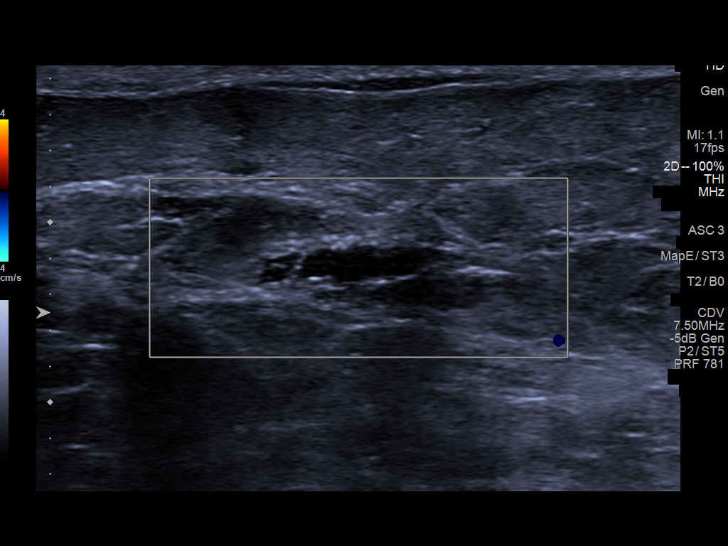
[im 4/10]
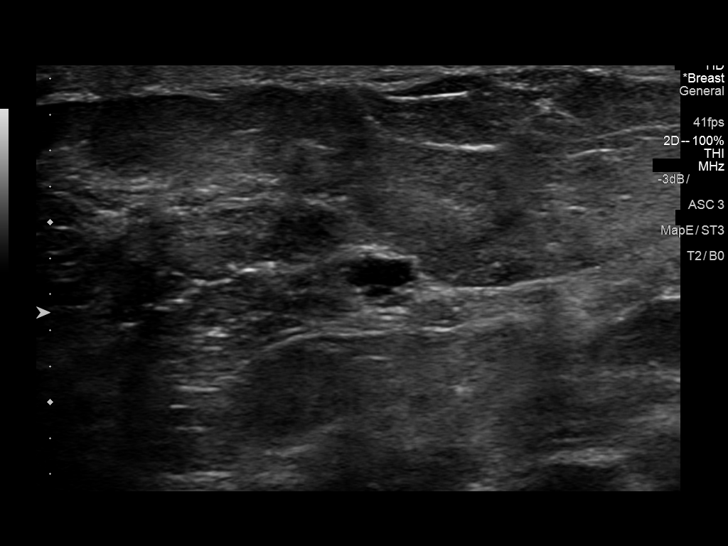
[im 5/10]
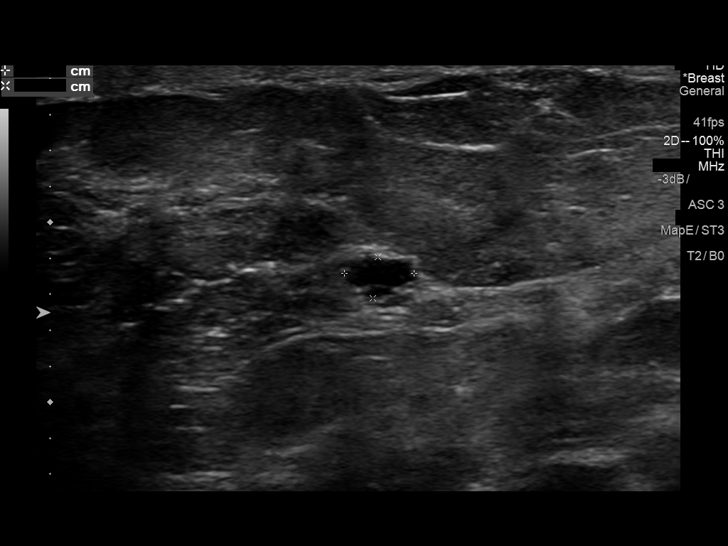
[im 6/10]
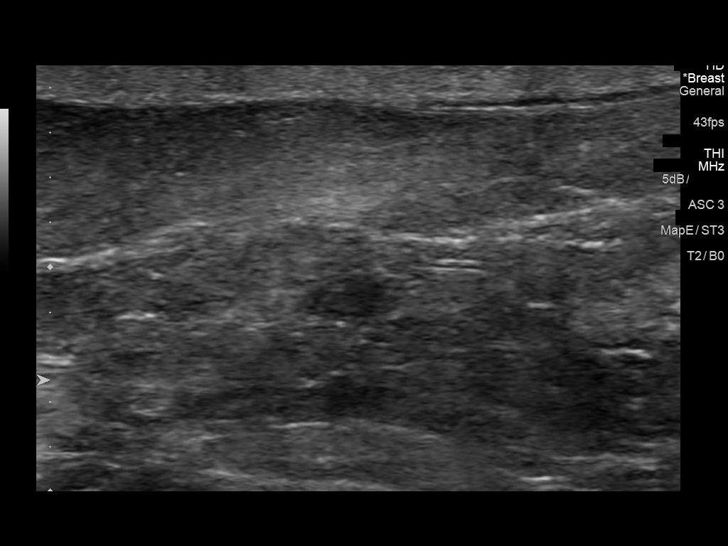
[im 7/10]
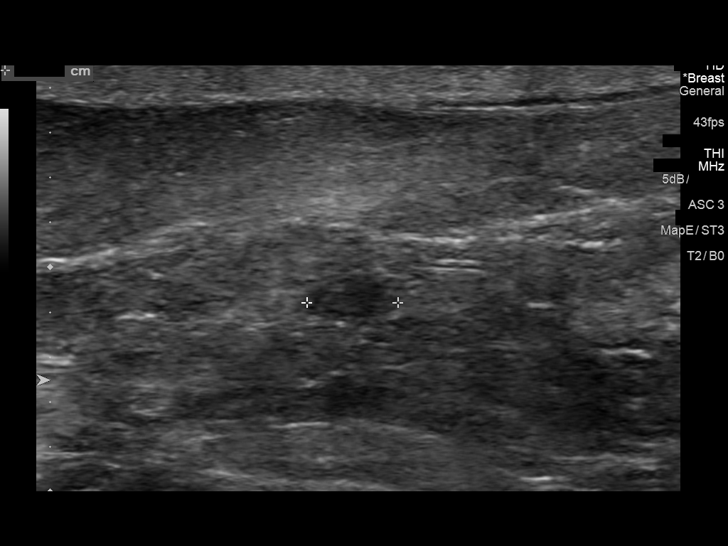
[im 8/10]
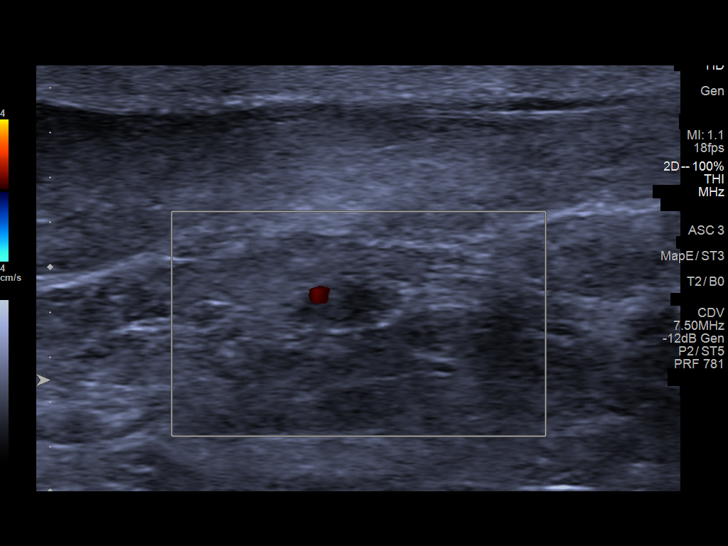
[im 9/10]
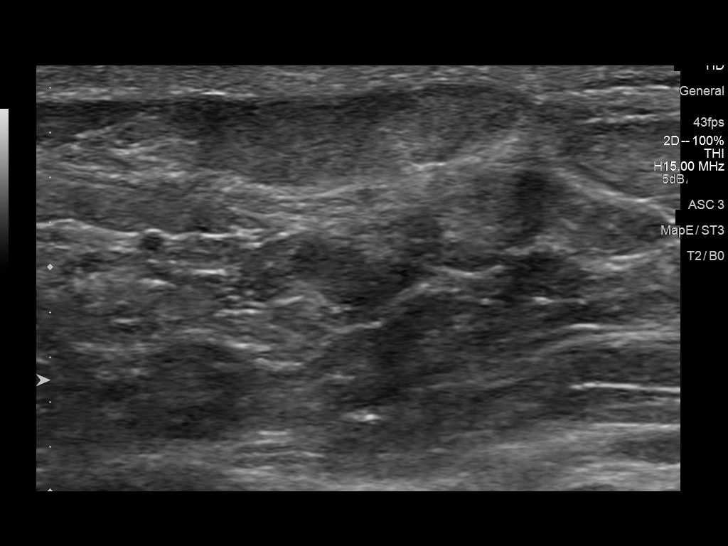
[im 10/10]
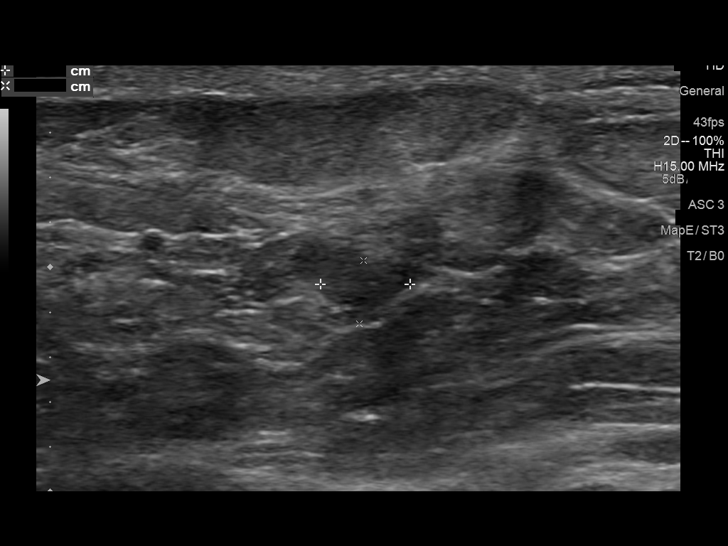

[10 of 10 positions shown; findings below may reference images not displayed]

ACR Breast Density Category b: There are scattered areas of
fibroglandular density.
FINDINGS: Standard full views of the left breast demonstrate no suspicious
masses, areas of architectural distortion or microcalcifications.
There is a 4 mm circumscribed nodule in the slightly upper central
left breast, which has been stable since 1500 and therefore is
considered benign. The previously seen low-density lobulated mass
adjacent to it, in the slightly upper outer quadrant, anterior
depth, effaces to glandular tissue.

Mammographic images were processed with CAD.

On physical exam, no suspicious masses are palpated.

Targeted ultrasound is performed, showing left breast 4 o'clock 1 cm
from the nipple stable benign-appearing septated cyst measuring
x 0.3 by 0.9 cm. There is a benign-appearing intramammary lymph node
in the adjacent breast parenchyma measuring 4 mm.
IMPRESSION: Stable benign left breast subcentimeter nodules.

RECOMMENDATION:
Bilateral screening mammogram in February 2018 to restore patient's
yearly schedule.

I have discussed the findings and recommendations with the patient.
Results were also provided in writing at the conclusion of the
visit. If applicable, a reminder letter will be sent to the patient
regarding the next appointment.

BI-RADS CATEGORY  2: Benign.

## 2020-06-08 ENCOUNTER — Other Ambulatory Visit: Payer: Self-pay | Admitting: Family Medicine

## 2020-06-08 ENCOUNTER — Other Ambulatory Visit: Payer: Self-pay | Admitting: Physician Assistant

## 2020-06-08 DIAGNOSIS — Z780 Asymptomatic menopausal state: Secondary | ICD-10-CM

## 2020-06-08 DIAGNOSIS — Z1231 Encounter for screening mammogram for malignant neoplasm of breast: Secondary | ICD-10-CM

## 2020-08-02 ENCOUNTER — Ambulatory Visit
Admission: RE | Admit: 2020-08-02 | Discharge: 2020-08-02 | Disposition: A | Discharge status: Home / Self Care | Payer: Medicare HMO | Source: Ambulatory Visit | Attending: Family Medicine | Admitting: Family Medicine

## 2020-08-02 ENCOUNTER — Ambulatory Visit
Admission: RE | Admit: 2020-08-02 | Discharge: 2020-08-02 | Disposition: A | Discharge status: Home / Self Care | Payer: Medicare HMO | Source: Ambulatory Visit | Attending: Physician Assistant | Admitting: Physician Assistant

## 2020-08-02 ENCOUNTER — Other Ambulatory Visit: Payer: Self-pay

## 2020-08-02 DIAGNOSIS — Z780 Asymptomatic menopausal state: Secondary | ICD-10-CM | POA: Diagnosis present

## 2020-08-02 DIAGNOSIS — Z1231 Encounter for screening mammogram for malignant neoplasm of breast: Secondary | ICD-10-CM | POA: Insufficient documentation

## 2021-06-22 ENCOUNTER — Other Ambulatory Visit: Payer: Self-pay | Admitting: Physician Assistant

## 2021-06-22 DIAGNOSIS — Z1231 Encounter for screening mammogram for malignant neoplasm of breast: Secondary | ICD-10-CM

## 2021-08-08 ENCOUNTER — Ambulatory Visit
Admission: RE | Admit: 2021-08-08 | Discharge: 2021-08-08 | Disposition: A | Discharge status: Home / Self Care | Payer: Medicare HMO | Source: Ambulatory Visit | Attending: Physician Assistant | Admitting: Physician Assistant

## 2021-08-08 DIAGNOSIS — Z1231 Encounter for screening mammogram for malignant neoplasm of breast: Secondary | ICD-10-CM | POA: Insufficient documentation

## 2021-11-23 ENCOUNTER — Other Ambulatory Visit: Payer: Self-pay | Admitting: Surgery

## 2021-12-02 ENCOUNTER — Encounter
Admission: RE | Admit: 2021-12-02 | Discharge: 2021-12-02 | Disposition: A | Discharge status: Home / Self Care | Payer: Medicare HMO | Source: Ambulatory Visit | Attending: Surgery | Admitting: Surgery

## 2021-12-02 ENCOUNTER — Other Ambulatory Visit: Payer: Self-pay

## 2021-12-02 VITALS — BP 134/71 | HR 77 | Resp 20 | Ht 61.0 in | Wt 242.0 lb

## 2021-12-02 DIAGNOSIS — Z01818 Encounter for other preprocedural examination: Secondary | ICD-10-CM | POA: Insufficient documentation

## 2021-12-02 DIAGNOSIS — E119 Type 2 diabetes mellitus without complications: Secondary | ICD-10-CM

## 2021-12-02 HISTORY — DX: Hypertensive heart disease without heart failure: I11.9

## 2021-12-02 HISTORY — DX: Thoracic aortic ectasia: I77.810

## 2021-12-02 HISTORY — DX: Dyspnea, unspecified: R06.00

## 2021-12-02 HISTORY — DX: Barrett's esophagus without dysplasia: K22.70

## 2021-12-02 HISTORY — DX: Depression, unspecified: F32.A

## 2021-12-02 LAB — COMPREHENSIVE METABOLIC PANEL
ALT: 36 U/L (ref 0–44)
AST: 30 U/L (ref 15–41)
Albumin: 3.7 g/dL (ref 3.5–5.0)
Alkaline Phosphatase: 69 U/L (ref 38–126)
Anion gap: 8 (ref 5–15)
BUN: 23 mg/dL (ref 8–23)
CO2: 23 mmol/L (ref 22–32)
Calcium: 9.2 mg/dL (ref 8.9–10.3)
Chloride: 107 mmol/L (ref 98–111)
Creatinine, Ser: 1.21 mg/dL — ABNORMAL HIGH (ref 0.44–1.00)
GFR, Estimated: 47 mL/min — ABNORMAL LOW (ref >60)
Glucose, Bld: 128 mg/dL — ABNORMAL HIGH (ref 70–99)
Potassium: 4.2 mmol/L (ref 3.5–5.1)
Sodium: 138 mmol/L (ref 135–145)
Total Bilirubin: 1 mg/dL (ref 0.3–1.2)
Total Protein: 7.1 g/dL (ref 6.5–8.1)

## 2021-12-02 LAB — CBC WITH DIFFERENTIAL/PLATELET
Abs Immature Granulocytes: 0.02 10*3/uL (ref 0.00–0.07)
Basophils Absolute: 0.1 10*3/uL (ref 0.0–0.1)
Basophils Relative: 1 %
Eosinophils Absolute: 0.3 10*3/uL (ref 0.0–0.5)
Eosinophils Relative: 4 %
HCT: 35.5 % — ABNORMAL LOW (ref 36.0–46.0)
Hemoglobin: 11.4 g/dL — ABNORMAL LOW (ref 12.0–15.0)
Immature Granulocytes: 0 %
Lymphocytes Relative: 22 %
Lymphs Abs: 1.4 10*3/uL (ref 0.7–4.0)
MCH: 28.9 pg (ref 26.0–34.0)
MCHC: 32.1 g/dL (ref 30.0–36.0)
MCV: 89.9 fL (ref 80.0–100.0)
Monocytes Absolute: 0.6 10*3/uL (ref 0.1–1.0)
Monocytes Relative: 9 %
Neutro Abs: 4.1 10*3/uL (ref 1.7–7.7)
Neutrophils Relative %: 64 %
Platelets: 272 10*3/uL (ref 150–400)
RBC: 3.95 MIL/uL (ref 3.87–5.11)
RDW: 13.6 % (ref 11.5–15.5)
WBC: 6.4 10*3/uL (ref 4.0–10.5)
nRBC: 0 % (ref 0.0–0.2)

## 2021-12-02 LAB — URINALYSIS, ROUTINE W REFLEX MICROSCOPIC
Bacteria, UA: NONE SEEN
Bilirubin Urine: NEGATIVE
Glucose, UA: NEGATIVE mg/dL
Hgb urine dipstick: NEGATIVE
Ketones, ur: NEGATIVE mg/dL
Nitrite: NEGATIVE
Protein, ur: NEGATIVE mg/dL
Specific Gravity, Urine: 1.016 (ref 1.005–1.030)
pH: 5 (ref 5.0–8.0)

## 2021-12-02 LAB — TYPE AND SCREEN
ABO/RH(D): O POS
Antibody Screen: NEGATIVE

## 2021-12-02 LAB — SURGICAL PCR SCREEN
MRSA, PCR: NEGATIVE
Staphylococcus aureus: NEGATIVE

## 2021-12-02 NOTE — Patient Instructions (Addendum)
Your procedure is scheduled on: 12/13/21 Report to DAY SURGERY DEPARTMENT LOCATED ON 2ND FLOOR MEDICAL MALL ENTRANCE. To find out your arrival time please call [phone] between 1PM - 3PM on 12/12/21.  Remember: Instructions that are not followed completely may result in serious medical risk, up to and including death, or upon the discretion of your surgeon and anesthesiologist your surgery may need to be rescheduled.     _X__ 1. Do not eat food after midnight the night before your procedure.                 No gum chewing or hard candies. You may drink clear liquids up to 2 hours                 before you are scheduled to arrive for your surgery- DO not drink clear                 liquids within 2 hours of the start of your surgery.                  Diabetics water only  Drink the G2 Gatorade 2 hours before arriving to the hospital  __X__2.  On the morning of surgery brush your teeth with toothpaste and water, you                 may rinse your mouth with mouthwash if you wish.  Do not swallow any              toothpaste of mouthwash.     _X__ 3.  No Alcohol for 24 hours before or after surgery.   _X__ 4.  Do Not Smoke or use e-cigarettes For 24 Hours Prior to Your Surgery.                 Do not use any chewable tobacco products for at least 6 hours prior to                 surgery.  ____  5.  Bring all medications with you on the day of surgery if instructed.   __X__  6.  Notify your doctor if there is any change in your medical condition      (cold, fever, infections).     Do not wear jewelry, make-up, hairpins, clips or nail polish. Do not wear lotions, powders, or perfumes. You may use deodorant Do not shave body hair 48 hours prior to surgery. Men may shave face and neck. Do not bring valuables to the hospital.    [facility] is not responsible for any belongings or valuables.  Contacts, dentures/partials or body piercings may not be worn into surgery. Bring a case for  your contacts, glasses or hearing aids, a denture cup will be supplied. Leave your suitcase in the car. After surgery it may be brought to your room. For patients admitted to the hospital, discharge time is determined by your treatment team.   Patients discharged the day of surgery will not be allowed to drive home.   Please read over the following fact sheets that you were given:   MRSA Information, CHG soap, Incentive Spirometer  __X__ Take these medicines the morning of surgery with A SIP OF WATER:    1. atorvastatin (LIPITOR) 20 MG tablet  2. carvedilol (COREG) 12.5 MG tablet  3. FLUoxetine (PROZAC) 40 MG capsule  4.  5.  6.  ____ Fleet Enema (as directed)   __X__ Use CHG Soap/SAGE  wipes as directed  ____ Use inhalers on the day of surgery  __X__ Stop metformin/Janumet/Farxiga 2 days prior to surgery Last dose will be Sunday morning 12/11/21   ____ Take 1/2 of usual insulin dose the night before surgery. No insulin the morning          of surgery.   ____ Stop Blood Thinners Coumadin/Plavix/Xarelto/Pleta/Pradaxa/Eliquis/Effient/Aspirin  on   Or contact your Surgeon, Cardiologist or Medical Doctor regarding  ability to stop your blood thinners  __X__ Stop Anti-inflammatories 7 days before surgery such as Advil, Ibuprofen, Motrin,  BC or Goodies Powder, Naprosyn, Naproxen, Aleve   __X__ Stop all herbals and supplements, fish oil or vitamins for 1 week until after surgery.    __X__ Bring C-Pap to the hospital.    Hold your aspirin as instructed by Dr Roland Rack or his staff. (5 days prior, last dose Thurs 10/12)

## 2021-12-13 ENCOUNTER — Other Ambulatory Visit: Payer: Self-pay

## 2021-12-13 ENCOUNTER — Encounter: Payer: Self-pay | Admitting: Surgery

## 2021-12-13 ENCOUNTER — Ambulatory Visit: Payer: Medicare HMO | Admitting: Urgent Care

## 2021-12-13 ENCOUNTER — Ambulatory Visit: Payer: Medicare HMO

## 2021-12-13 ENCOUNTER — Encounter
Admission: RE | Disposition: A | Discharge status: Home Health | Payer: Self-pay | Source: Home / Self Care | Attending: Surgery

## 2021-12-13 ENCOUNTER — Observation Stay
Admission: RE | Admit: 2021-12-13 | Discharge: 2021-12-14 | Disposition: A | Discharge status: Home Health | Payer: Medicare HMO | Attending: Surgery | Admitting: Surgery

## 2021-12-13 DIAGNOSIS — N1831 Chronic kidney disease, stage 3a: Secondary | ICD-10-CM | POA: Insufficient documentation

## 2021-12-13 DIAGNOSIS — Z87891 Personal history of nicotine dependence: Secondary | ICD-10-CM | POA: Insufficient documentation

## 2021-12-13 DIAGNOSIS — Z79899 Other long term (current) drug therapy: Secondary | ICD-10-CM | POA: Insufficient documentation

## 2021-12-13 DIAGNOSIS — I129 Hypertensive chronic kidney disease with stage 1 through stage 4 chronic kidney disease, or unspecified chronic kidney disease: Secondary | ICD-10-CM | POA: Diagnosis not present

## 2021-12-13 DIAGNOSIS — Z7984 Long term (current) use of oral hypoglycemic drugs: Secondary | ICD-10-CM | POA: Insufficient documentation

## 2021-12-13 DIAGNOSIS — E1122 Type 2 diabetes mellitus with diabetic chronic kidney disease: Secondary | ICD-10-CM | POA: Diagnosis not present

## 2021-12-13 DIAGNOSIS — Z7982 Long term (current) use of aspirin: Secondary | ICD-10-CM | POA: Diagnosis not present

## 2021-12-13 DIAGNOSIS — E119 Type 2 diabetes mellitus without complications: Secondary | ICD-10-CM

## 2021-12-13 DIAGNOSIS — M1711 Unilateral primary osteoarthritis, right knee: Principal | ICD-10-CM | POA: Insufficient documentation

## 2021-12-13 DIAGNOSIS — Z96651 Presence of right artificial knee joint: Secondary | ICD-10-CM

## 2021-12-13 HISTORY — PX: TOTAL KNEE ARTHROPLASTY: SHX125

## 2021-12-13 LAB — GLUCOSE, CAPILLARY
Glucose-Capillary: 138 mg/dL — ABNORMAL HIGH (ref 70–99)
Glucose-Capillary: 132 mg/dL — ABNORMAL HIGH (ref 70–99)
Glucose-Capillary: 169 mg/dL — ABNORMAL HIGH (ref 70–99)
Glucose-Capillary: 210 mg/dL — ABNORMAL HIGH (ref 70–99)

## 2021-12-13 LAB — ABO/RH: ABO/RH(D): O POS

## 2021-12-13 SURGERY — ARTHROPLASTY, KNEE, TOTAL
Anesthesia: Spinal | Site: Knee | Laterality: Right

## 2021-12-13 MED ORDER — OXYCODONE HCL 5 MG PO TABS
5.0000 mg | ORAL_TABLET | Freq: Once | ORAL | Status: AC | PRN
  Administered 2021-12-13: 5 mg via ORAL

## 2021-12-13 MED ORDER — PROPOFOL 500 MG/50ML IV EMUL
INTRAVENOUS | Status: DC | PRN
  Administered 2021-12-13: 75 ug/kg/min via INTRAVENOUS

## 2021-12-13 MED ORDER — PROPOFOL 10 MG/ML IV BOLUS
INTRAVENOUS | Status: AC
  Filled 2021-12-13: qty 20

## 2021-12-13 MED ORDER — TRANEXAMIC ACID 1000 MG/10ML IV SOLN
INTRAVENOUS | Status: AC
  Filled 2021-12-13: qty 10

## 2021-12-13 MED ORDER — SODIUM CHLORIDE 0.9 % IV SOLN
INTRAVENOUS | Status: DC | PRN
  Administered 2021-12-13: 60 mL

## 2021-12-13 MED ORDER — KETOROLAC TROMETHAMINE 30 MG/ML IJ SOLN
INTRAMUSCULAR | Status: DC | PRN
  Administered 2021-12-13: 15 mg via INTRAVENOUS

## 2021-12-13 MED ORDER — ACETAMINOPHEN 500 MG PO TABS
1000.0000 mg | ORAL_TABLET | Freq: Four times a day (QID) | ORAL | Status: DC
  Administered 2021-12-13 – 2021-12-14 (×2): 1000 mg via ORAL

## 2021-12-13 MED ORDER — FLUOXETINE HCL 20 MG PO CAPS
40.0000 mg | ORAL_CAPSULE | Freq: Every day | ORAL | Status: DC
  Filled 2021-12-13: qty 2

## 2021-12-13 MED ORDER — OXYCODONE HCL 5 MG PO TABS
ORAL_TABLET | ORAL | Status: AC
  Filled 2021-12-13: qty 1

## 2021-12-13 MED ORDER — DIPHENHYDRAMINE HCL 12.5 MG/5ML PO ELIX
ORAL_SOLUTION | ORAL | Status: AC
  Filled 2021-12-13: qty 10

## 2021-12-13 MED ORDER — BUPIVACAINE HCL (PF) 0.5 % IJ SOLN
INTRAMUSCULAR | Status: DC | PRN
  Administered 2021-12-13: 3 mL

## 2021-12-13 MED ORDER — LIDOCAINE HCL (PF) 2 % IJ SOLN
INTRAMUSCULAR | Status: AC
  Filled 2021-12-13: qty 20

## 2021-12-13 MED ORDER — ROCURONIUM BROMIDE 10 MG/ML (PF) SYRINGE
PREFILLED_SYRINGE | INTRAVENOUS | Status: AC
  Filled 2021-12-13: qty 10

## 2021-12-13 MED ORDER — TRIAMCINOLONE ACETONIDE 40 MG/ML IJ SUSP
INTRAMUSCULAR | Status: AC
  Filled 2021-12-13: qty 2

## 2021-12-13 MED ORDER — METOCLOPRAMIDE HCL 5 MG/ML IJ SOLN: 5.0000 mg | Freq: Three times a day (TID) | INTRAMUSCULAR | Status: DC | PRN

## 2021-12-13 MED ORDER — SODIUM CHLORIDE 0.9 % IV SOLN: INTRAVENOUS | Status: DC

## 2021-12-13 MED ORDER — ONDANSETRON HCL 4 MG/2ML IJ SOLN: 4.0000 mg | Freq: Four times a day (QID) | INTRAMUSCULAR | Status: DC | PRN

## 2021-12-13 MED ORDER — HYDROMORPHONE HCL 1 MG/ML IJ SOLN: 0.2500 mg | INTRAMUSCULAR | Status: DC | PRN

## 2021-12-13 MED ORDER — BUPIVACAINE-EPINEPHRINE (PF) 0.5% -1:200000 IJ SOLN
INTRAMUSCULAR | Status: AC
  Filled 2021-12-13: qty 30

## 2021-12-13 MED ORDER — SODIUM CHLORIDE FLUSH 0.9 % IV SOLN
INTRAVENOUS | Status: AC
  Filled 2021-12-13: qty 20

## 2021-12-13 MED ORDER — OXYCODONE HCL 5 MG/5ML PO SOLN: 5.0000 mg | Freq: Once | ORAL | Status: AC | PRN

## 2021-12-13 MED ORDER — PROMETHAZINE HCL 25 MG/ML IJ SOLN: 6.2500 mg | INTRAMUSCULAR | Status: DC | PRN

## 2021-12-13 MED ORDER — ONDANSETRON HCL 4 MG PO TABS
ORAL_TABLET | ORAL | Status: AC
  Filled 2021-12-13: qty 1

## 2021-12-13 MED ORDER — TRIAMCINOLONE ACETONIDE 40 MG/ML IJ SUSP
INTRAMUSCULAR | Status: DC | PRN
  Administered 2021-12-13: 80 mg via INTRAMUSCULAR

## 2021-12-13 MED ORDER — KETOROLAC TROMETHAMINE 30 MG/ML IJ SOLN
INTRAMUSCULAR | Status: AC
  Filled 2021-12-13: qty 1

## 2021-12-13 MED ORDER — FENTANYL CITRATE (PF) 100 MCG/2ML IJ SOLN: 25.0000 ug | INTRAMUSCULAR | Status: DC | PRN

## 2021-12-13 MED ORDER — 0.9 % SODIUM CHLORIDE (POUR BTL) OPTIME
TOPICAL | Status: DC | PRN
  Administered 2021-12-13: 500 mL

## 2021-12-13 MED ORDER — PHENYLEPHRINE HCL-NACL 20-0.9 MG/250ML-% IV SOLN
INTRAVENOUS | Status: DC | PRN
  Administered 2021-12-13: 20 ug/min via INTRAVENOUS

## 2021-12-13 MED ORDER — OXYCODONE HCL 5 MG PO TABS: 5.0000 mg | ORAL_TABLET | ORAL | 0 refills | Status: DC | PRN

## 2021-12-13 MED ORDER — ONDANSETRON HCL 4 MG PO TABS: 4.0000 mg | ORAL_TABLET | Freq: Four times a day (QID) | ORAL | Status: DC | PRN

## 2021-12-13 MED ORDER — OXYCODONE HCL 5 MG PO TABS
ORAL_TABLET | ORAL | Status: AC
  Administered 2021-12-13: 5 mg via ORAL
  Filled 2021-12-13: qty 1

## 2021-12-13 MED ORDER — FLUTICASONE PROPIONATE 50 MCG/ACT NA SUSP: 2.0000 | Freq: Every day | NASAL | Status: DC | PRN

## 2021-12-13 MED ORDER — SODIUM CHLORIDE 0.9 % BOLUS PEDS: 250.0000 mL | Freq: Once | INTRAVENOUS | Status: DC

## 2021-12-13 MED ORDER — PANTOPRAZOLE SODIUM 40 MG PO TBEC: 40.0000 mg | DELAYED_RELEASE_TABLET | Freq: Every day | ORAL | Status: DC

## 2021-12-13 MED ORDER — BUPIVACAINE HCL (PF) 0.5 % IJ SOLN
INTRAMUSCULAR | Status: AC
  Filled 2021-12-13: qty 10

## 2021-12-13 MED ORDER — CEFAZOLIN SODIUM-DEXTROSE 2-4 GM/100ML-% IV SOLN
2.0000 g | INTRAVENOUS | Status: AC
  Administered 2021-12-13: 2 g via INTRAVENOUS

## 2021-12-13 MED ORDER — ONDANSETRON HCL 4 MG/2ML IJ SOLN
INTRAMUSCULAR | Status: DC | PRN
  Administered 2021-12-13: 4 mg via INTRAVENOUS

## 2021-12-13 MED ORDER — METFORMIN HCL 500 MG PO TABS
1000.0000 mg | ORAL_TABLET | Freq: Every day | ORAL | Status: DC
  Administered 2021-12-14: 1000 mg via ORAL
  Filled 2021-12-13: qty 2

## 2021-12-13 MED ORDER — CARVEDILOL 12.5 MG PO TABS
12.5000 mg | ORAL_TABLET | Freq: Two times a day (BID) | ORAL | Status: DC
  Administered 2021-12-14: 12.5 mg via ORAL

## 2021-12-13 MED ORDER — SODIUM CHLORIDE 0.9 % BOLUS PEDS
250.0000 mL | Freq: Once | INTRAVENOUS | Status: AC
  Administered 2021-12-13: 250 mL via INTRAVENOUS

## 2021-12-13 MED ORDER — LOSARTAN POTASSIUM 50 MG PO TABS: 50.0000 mg | ORAL_TABLET | Freq: Every day | ORAL | Status: DC

## 2021-12-13 MED ORDER — EPHEDRINE 5 MG/ML INJ
INTRAVENOUS | Status: AC
  Filled 2021-12-13: qty 15

## 2021-12-13 MED ORDER — GLYCOPYRROLATE 0.2 MG/ML IJ SOLN
INTRAMUSCULAR | Status: AC
  Filled 2021-12-13: qty 4

## 2021-12-13 MED ORDER — DROPERIDOL 2.5 MG/ML IJ SOLN
INTRAMUSCULAR | Status: AC
  Administered 2021-12-13: 0.625 mg via INTRAVENOUS
  Filled 2021-12-13: qty 2

## 2021-12-13 MED ORDER — LORATADINE 10 MG PO TABS: 10.0000 mg | ORAL_TABLET | Freq: Every day | ORAL | Status: DC

## 2021-12-13 MED ORDER — CEFAZOLIN SODIUM-DEXTROSE 2-4 GM/100ML-% IV SOLN
2.0000 g | Freq: Four times a day (QID) | INTRAVENOUS | Status: AC
  Administered 2021-12-14: 2 g via INTRAVENOUS

## 2021-12-13 MED ORDER — MAGNESIUM HYDROXIDE 400 MG/5ML PO SUSP: 30.0000 mL | Freq: Every day | ORAL | Status: DC | PRN

## 2021-12-13 MED ORDER — INSULIN ASPART 100 UNIT/ML IJ SOLN: 0.0000 [IU] | Freq: Three times a day (TID) | INTRAMUSCULAR | Status: DC

## 2021-12-13 MED ORDER — KETOROLAC TROMETHAMINE 15 MG/ML IJ SOLN
INTRAMUSCULAR | Status: AC
  Administered 2021-12-13: 15 mg via INTRAVENOUS
  Filled 2021-12-13: qty 1

## 2021-12-13 MED ORDER — METFORMIN HCL 500 MG PO TABS: 500.0000 mg | ORAL_TABLET | Freq: Every day | ORAL | Status: DC

## 2021-12-13 MED ORDER — CEFAZOLIN SODIUM-DEXTROSE 2-4 GM/100ML-% IV SOLN
INTRAVENOUS | Status: AC
  Administered 2021-12-13: 2 g via INTRAVENOUS
  Filled 2021-12-13: qty 100

## 2021-12-13 MED ORDER — DEXAMETHASONE SODIUM PHOSPHATE 10 MG/ML IJ SOLN
INTRAMUSCULAR | Status: AC
  Filled 2021-12-13: qty 3

## 2021-12-13 MED ORDER — CEFAZOLIN SODIUM 1 G IJ SOLR
INTRAMUSCULAR | Status: AC
  Filled 2021-12-13: qty 40

## 2021-12-13 MED ORDER — DOCUSATE SODIUM 100 MG PO CAPS
ORAL_CAPSULE | ORAL | Status: AC
  Filled 2021-12-13: qty 1

## 2021-12-13 MED ORDER — DIPHENHYDRAMINE HCL 12.5 MG/5ML PO ELIX
12.5000 mg | ORAL_SOLUTION | ORAL | Status: DC | PRN
  Administered 2021-12-13: 25 mg via ORAL

## 2021-12-13 MED ORDER — FLEET ENEMA 7-19 GM/118ML RE ENEM: 1.0000 | ENEMA | Freq: Once | RECTAL | Status: DC | PRN

## 2021-12-13 MED ORDER — METOCLOPRAMIDE HCL 10 MG PO TABS: 5.0000 mg | ORAL_TABLET | Freq: Three times a day (TID) | ORAL | Status: DC | PRN

## 2021-12-13 MED ORDER — ORAL CARE MOUTH RINSE: 15.0000 mL | Freq: Once | OROMUCOSAL | Status: AC

## 2021-12-13 MED ORDER — ONDANSETRON HCL 4 MG/2ML IJ SOLN
INTRAMUSCULAR | Status: AC
  Filled 2021-12-13: qty 10

## 2021-12-13 MED ORDER — DOCUSATE SODIUM 100 MG PO CAPS: 100.0000 mg | ORAL_CAPSULE | Freq: Two times a day (BID) | ORAL | Status: DC

## 2021-12-13 MED ORDER — TRANEXAMIC ACID 1000 MG/10ML IV SOLN
INTRAVENOUS | Status: DC | PRN
  Administered 2021-12-13: 1000 mg via TOPICAL

## 2021-12-13 MED ORDER — CHLORHEXIDINE GLUCONATE 0.12 % MT SOLN
OROMUCOSAL | Status: AC
  Administered 2021-12-13: 15 mL via OROMUCOSAL
  Filled 2021-12-13: qty 15

## 2021-12-13 MED ORDER — SODIUM CHLORIDE FLUSH 0.9 % IV SOLN
INTRAVENOUS | Status: AC
  Filled 2021-12-13: qty 10

## 2021-12-13 MED ORDER — BISACODYL 10 MG RE SUPP: 10.0000 mg | Freq: Every day | RECTAL | Status: DC | PRN

## 2021-12-13 MED ORDER — CEFAZOLIN SODIUM-DEXTROSE 2-4 GM/100ML-% IV SOLN
INTRAVENOUS | Status: AC
  Filled 2021-12-13: qty 100

## 2021-12-13 MED ORDER — PHENYLEPHRINE HCL-NACL 20-0.9 MG/250ML-% IV SOLN
INTRAVENOUS | Status: AC
  Filled 2021-12-13: qty 250

## 2021-12-13 MED ORDER — SODIUM CHLORIDE (PF) 0.9 % IJ SOLN
INTRAMUSCULAR | Status: AC
  Filled 2021-12-13: qty 10

## 2021-12-13 MED ORDER — KETOROLAC TROMETHAMINE 15 MG/ML IJ SOLN
7.5000 mg | Freq: Four times a day (QID) | INTRAMUSCULAR | Status: DC
  Administered 2021-12-13 – 2021-12-14 (×2): 7.5 mg via INTRAVENOUS

## 2021-12-13 MED ORDER — KETOROLAC TROMETHAMINE 15 MG/ML IJ SOLN: 15.0000 mg | Freq: Once | INTRAMUSCULAR | Status: AC

## 2021-12-13 MED ORDER — PROPOFOL 1000 MG/100ML IV EMUL
INTRAVENOUS | Status: AC
  Filled 2021-12-13: qty 500

## 2021-12-13 MED ORDER — BUPIVACAINE-EPINEPHRINE (PF) 0.5% -1:200000 IJ SOLN
INTRAMUSCULAR | Status: DC | PRN
  Administered 2021-12-13: 30 mL via PERINEURAL

## 2021-12-13 MED ORDER — APIXABAN 2.5 MG PO TABS: 2.5000 mg | ORAL_TABLET | Freq: Two times a day (BID) | ORAL | Status: DC

## 2021-12-13 MED ORDER — ATORVASTATIN CALCIUM 20 MG PO TABS
20.0000 mg | ORAL_TABLET | Freq: Every evening | ORAL | Status: DC
  Filled 2021-12-13: qty 1

## 2021-12-13 MED ORDER — BUPIVACAINE LIPOSOME 1.3 % IJ SUSP
INTRAMUSCULAR | Status: AC
  Filled 2021-12-13: qty 20

## 2021-12-13 MED ORDER — ACETAMINOPHEN 325 MG PO TABS: 325.0000 mg | ORAL_TABLET | Freq: Four times a day (QID) | ORAL | Status: DC | PRN

## 2021-12-13 MED ORDER — ACETAMINOPHEN 10 MG/ML IV SOLN: 1000.0000 mg | Freq: Once | INTRAVENOUS | Status: DC | PRN

## 2021-12-13 MED ORDER — KETOROLAC TROMETHAMINE 15 MG/ML IJ SOLN
INTRAMUSCULAR | Status: AC
  Filled 2021-12-13: qty 1

## 2021-12-13 MED ORDER — DROPERIDOL 2.5 MG/ML IJ SOLN: 0.6250 mg | Freq: Once | INTRAMUSCULAR | Status: AC | PRN

## 2021-12-13 MED ORDER — SODIUM CHLORIDE FLUSH 0.9 % IV SOLN
INTRAVENOUS | Status: AC
  Filled 2021-12-13: qty 40

## 2021-12-13 MED ORDER — APIXABAN 2.5 MG PO TABS: 2.5000 mg | ORAL_TABLET | Freq: Two times a day (BID) | ORAL | 0 refills | Status: DC

## 2021-12-13 MED ORDER — OXYCODONE HCL 5 MG PO TABS: 5.0000 mg | ORAL_TABLET | ORAL | Status: DC | PRN

## 2021-12-13 MED ORDER — ACETAMINOPHEN 500 MG PO TABS
ORAL_TABLET | ORAL | Status: AC
  Filled 2021-12-13: qty 2

## 2021-12-13 MED ORDER — CHLORHEXIDINE GLUCONATE 0.12 % MT SOLN: 15.0000 mL | Freq: Once | OROMUCOSAL | Status: AC

## 2021-12-13 MED ORDER — METFORMIN HCL 500 MG PO TABS
500.0000 mg | ORAL_TABLET | Freq: Every day | ORAL | Status: DC
  Administered 2021-12-13: 500 mg via ORAL
  Filled 2021-12-13 (×2): qty 1

## 2021-12-13 SURGICAL SUPPLY — 67 items
BIT DRILL QUICK REL 1/8 2PK SL (DRILL) IMPLANT
BLADE SAW SAG 25X90X1.19 (BLADE) ×1 IMPLANT
BLADE SURG SZ20 CARB STEEL (BLADE) ×1 IMPLANT
BNDG ELASTIC 6X5.8 VLCR NS LF (GAUZE/BANDAGES/DRESSINGS) ×1 IMPLANT
CEMENT BONE R 1X40 (Cement) ×2 IMPLANT
CEMENT VACUUM MIXING SYSTEM (MISCELLANEOUS) ×1 IMPLANT
CHLORAPREP W/TINT 26 (MISCELLANEOUS) ×1 IMPLANT
COMP PAT 3 PEG SERIES A 31/6.2 (Miscellaneous) ×1 IMPLANT
COMPONENT PAT3 PEG SERS 31/6.2 (Miscellaneous) IMPLANT
COOLER POLAR GLACIER W/PUMP (MISCELLANEOUS) ×1 IMPLANT
COVER MAYO STAND REUSABLE (DRAPES) ×1 IMPLANT
CUFF TOURN SGL QUICK 24 (TOURNIQUET CUFF)
CUFF TOURN SGL QUICK 34 (TOURNIQUET CUFF)
CUFF TRNQT CYL 24X4X16.5-23 (TOURNIQUET CUFF) IMPLANT
CUFF TRNQT CYL 34X4.125X (TOURNIQUET CUFF) IMPLANT
DRAPE 3/4 80X56 (DRAPES) ×1 IMPLANT
DRAPE IMP U-DRAPE 54X76 (DRAPES) ×1 IMPLANT
DRAPE U-SHAPE 47X51 STRL (DRAPES) ×1 IMPLANT
DRILL QUICK RELEASE 1/8 INCH (DRILL) ×3
DRSG MEPILEX SACRM 8.7X9.8 (GAUZE/BANDAGES/DRESSINGS) IMPLANT
DRSG OPSITE POSTOP 4X10 (GAUZE/BANDAGES/DRESSINGS) ×1 IMPLANT
DRSG OPSITE POSTOP 4X8 (GAUZE/BANDAGES/DRESSINGS) ×1 IMPLANT
ELECT CAUTERY BLADE 6.4 (BLADE) IMPLANT
ELECT REM PT RETURN 9FT ADLT (ELECTROSURGICAL) ×1
ELECTRODE REM PT RTRN 9FT ADLT (ELECTROSURGICAL) ×1 IMPLANT
GAUZE XEROFORM 1X8 LF (GAUZE/BANDAGES/DRESSINGS) ×1 IMPLANT
GLOVE BIO SURGEON STRL SZ7.5 (GLOVE) ×4 IMPLANT
GLOVE BIO SURGEON STRL SZ8 (GLOVE) ×4 IMPLANT
GLOVE BIOGEL PI IND STRL 8 (GLOVE) ×1 IMPLANT
GLOVE SURG SYN 8.0 (GLOVE) ×1 IMPLANT
GLOVE SURG SYN 8.0 PF PI (GLOVE) IMPLANT
GLOVE SURG UNDER LTX SZ8 (GLOVE) ×1 IMPLANT
GOWN STRL REUS W/ TWL LRG LVL3 (GOWN DISPOSABLE) ×1 IMPLANT
GOWN STRL REUS W/ TWL XL LVL3 (GOWN DISPOSABLE) ×1 IMPLANT
GOWN STRL REUS W/TWL LRG LVL3 (GOWN DISPOSABLE) ×1
GOWN STRL REUS W/TWL XL LVL3 (GOWN DISPOSABLE) ×1
HOOD PEEL AWAY FLYTE STAYCOOL (MISCELLANEOUS) ×3 IMPLANT
INSERT TIB BEARING 67X16 (Insert) IMPLANT
IV NS IRRIG 3000ML ARTHROMATIC (IV SOLUTION) ×1 IMPLANT
KIT TURNOVER KIT A (KITS) ×1 IMPLANT
KNEE CR FEMORAL RT 65MM (Femur) IMPLANT
MANIFOLD NEPTUNE II (INSTRUMENTS) ×1 IMPLANT
NDL SPNL 20GX3.5 QUINCKE YW (NEEDLE) ×1 IMPLANT
NEEDLE SPNL 20GX3.5 QUINCKE YW (NEEDLE) ×1 IMPLANT
NS IRRIG 1000ML POUR BTL (IV SOLUTION) ×1 IMPLANT
PACK TOTAL KNEE (MISCELLANEOUS) ×1 IMPLANT
PAD WRAPON POLAR KNEE (MISCELLANEOUS) ×1 IMPLANT
PAD WRAPON POLOR MULTI XL (MISCELLANEOUS) IMPLANT
PENCIL SMOKE EVACUATOR (MISCELLANEOUS) ×1 IMPLANT
PLATE INTERLOK 6700 (Plate) IMPLANT
PULSAVAC PLUS IRRIG FAN TIP (DISPOSABLE) ×1
STAPLER SKIN PROX 35W (STAPLE) ×1 IMPLANT
STOCKINETTE M/LG 89821 (MISCELLANEOUS) IMPLANT
SUCTION FRAZIER HANDLE 10FR (MISCELLANEOUS) ×1
SUCTION TUBE FRAZIER 10FR DISP (MISCELLANEOUS) ×1 IMPLANT
SUT VIC AB 0 CT1 36 (SUTURE) ×3 IMPLANT
SUT VIC AB 2-0 CT1 27 (SUTURE) ×3
SUT VIC AB 2-0 CT1 TAPERPNT 27 (SUTURE) ×3 IMPLANT
SYR 10ML LL (SYRINGE) ×1 IMPLANT
SYR 20ML LL LF (SYRINGE) ×1 IMPLANT
SYR 30ML LL (SYRINGE) IMPLANT
TIP FAN IRRIG PULSAVAC PLUS (DISPOSABLE) ×1 IMPLANT
TRAP FLUID SMOKE EVACUATOR (MISCELLANEOUS) ×2 IMPLANT
WATER STERILE IRR 500ML POUR (IV SOLUTION) ×1 IMPLANT
WRAP-ON POLOR PAD MULTI XL (MISCELLANEOUS) ×1
WRAPON POLAR PAD KNEE (MISCELLANEOUS)
WRAPON POLOR PAD MULTI XL (MISCELLANEOUS) ×1

## 2021-12-13 NOTE — Transfer of Care (Signed)
Immediate Anesthesia Transfer of Care Note  Patient: Leitha Schuller Wessell  Procedure(s) Performed: TOTAL KNEE ARTHROPLASTY (Right: Knee)  Patient Location: PACU  Anesthesia Type:General and Spinal  Level of Consciousness: awake and alert   Airway & Oxygen Therapy: Patient Spontanous Breathing  Post-op Assessment: Report given to RN and Post -op Vital signs reviewed and stable  Post vital signs: Reviewed and stable  Last Vitals:  Vitals Value Taken Time  BP 123/60 12/13/21 1025  Temp 36.8 C 12/13/21 1025  Pulse 67 12/13/21 1025  Resp 14 12/13/21 1025  SpO2 97 % 12/13/21 1025    Last Pain:  Vitals:   12/13/21 0614  TempSrc: Oral  PainSc: 6          Complications: No notable events documented.

## 2021-12-13 NOTE — Anesthesia Postprocedure Evaluation (Signed)
Anesthesia Post Note  Patient: Cindy Singleton  Procedure(s) Performed: TOTAL KNEE ARTHROPLASTY (Right: Knee)  Patient location during evaluation: PACU Anesthesia Type: Spinal Level of consciousness: awake and alert Pain management: pain level controlled Vital Signs Assessment: post-procedure vital signs reviewed and stable Respiratory status: spontaneous breathing, nonlabored ventilation and respiratory function stable Cardiovascular status: blood pressure returned to baseline and stable Postop Assessment: no apparent nausea or vomiting Anesthetic complications: no   No notable events documented.   Last Vitals:  Vitals:   12/13/21 1100 12/13/21 1202  BP: (!) 141/84 (!) 140/64  Pulse: (!) 59 64  Resp: 20 18  Temp: 36.6 C 36.9 C  SpO2: 98% 98%    Last Pain:  Vitals:   12/13/21 1202  TempSrc: Temporal  PainSc: 0-No pain                 [place] Ouch

## 2021-12-13 NOTE — Discharge Instructions (Addendum)
Orthopedic discharge instructions: May shower/bathe with intact OpSite dressing. Apply ice frequently to knee or use Polar Care. Start Eliquis 1 tablet (2.5 mg) twice daily on Wednesday, 12/14/2021, for 2 weeks, then take aspirin 325 mg twice daily for 4 weeks. Take pain medication as prescribed when needed.  May supplement with ES Tylenol if necessary. May weight-bear as tolerated on right leg - use walker for balance and support. Follow-up in 10-14 days or as scheduled.  POLAR CARE INFORMATION  MassAdvertisement.it  How to use [facility] Polar Care [facility] Therapy System?  YouTube   ShippingScam.co.uk  OPERATING INSTRUCTIONS  Start the product With dry hands, connect the transformer to the electrical connection located on the top of the cooler. Next, plug the transformer into an appropriate electrical outlet. The unit will automatically start running at this point.  To stop the pump, disconnect electrical power.  Unplug to stop the product when not in use. Unplugging the Polar Care unit turns it off. Always unplug immediately after use. Never leave it plugged in while unattended. Remove pad.    FIRST ADD WATER TO FILL LINE, THEN ICE---Replace ice when existing ice is almost melted  1 Discuss Treatment with your Licensed Health Care Practitioner and Use Only as Prescribed 2 Apply Insulation Barrier & Cold Therapy Pad 3 Check for Moisture 4 Inspect Skin Regularly  Tips and Trouble Shooting Usage Tips 1. Use cubed or chunked ice for optimal performance. 2. It is recommended to drain the Pad between uses. To drain the pad, hold the Pad upright with the hose pointed toward the ground. Depress the black plunger and allow water to drain out. 3. You may disconnect the Pad from the unit without removing the pad from the affected area by depressing the silver tabs on the hose coupling and gently pulling the hoses apart. The Pad and unit will seal itself and will not leak.  Note: Some dripping during release is normal. 4. DO NOT RUN PUMP WITHOUT WATER! The pump in this unit is designed to run with water. Running the unit without water will cause permanent damage to the pump. 5. Unplug unit before removing lid.  TROUBLESHOOTING GUIDE Pump not running, Water not flowing to the pad, Pad is not getting cold 1. Make sure the transformer is plugged into the wall outlet. 2. Confirm that the ice and water are filled to the indicated levels. 3. Make sure there are no kinks in the pad. 4. Gently pull on the blue tube to make sure the tube/pad junction is straight. 5. Remove the pad from the treatment site and ll it while the pad is lying at; then reapply. 6. Confirm that the pad couplings are securely attached to the unit. Listen for the double clicks (Figure 1) to confirm the pad couplings are securely attached.  Leaks    Note: Some condensation on the lines, controller, and pads is unavoidable, especially in warmer climates. 1. If using a Breg Polar Care Cold Therapy unit with a detachable Cold Therapy Pad, and a leak exists (other than condensation on the lines) disconnect the pad couplings. Make sure the silver tabs on the couplings are depressed before reconnecting the pad to the pump hose; then confirm both sides of the coupling are properly clicked in. 2. If the coupling continues to leak or a leak is detected in the pad itself, stop using it and call Breg Customer Care at [phone].  Cleaning After use, empty and dry the unit with a soft cloth.  Warm water and mild detergent may be used occasionally to clean the pump and tubes.  WARNING: [facility] can be cold enough to cause serious injury, including full skin necrosis. Follow these Operating Instructions, and carefully read the Product Insert (see pouch on side of unit) and the Cold Therapy Pad Fitting Instructions (provided with each Cold Therapy Pad) prior to use.       AMBULATORY SURGERY   DISCHARGE INSTRUCTIONS   The drugs that you were given will stay in your system until tomorrow so for the next 24 hours you should not:  Drive an automobile Make any legal decisions Drink any alcoholic beverage   You may resume regular meals tomorrow.  Today it is better to start with liquids and gradually work up to solid foods.  You may eat anything you prefer, but it is better to start with liquids, then soup and crackers, and gradually work up to solid foods.   Please notify your doctor immediately if you have any unusual bleeding, trouble breathing, redness and pain at the surgery site, drainage, fever, or pain not relieved by medication.    Additional Instructions:  Please contact your physician with any problems or Same Day Surgery at [phone], Monday through Friday 6 am to 4 pm, or [place] at [facility] number at [phone].

## 2021-12-13 NOTE — Evaluation (Signed)
Physical Therapy Evaluation Patient Details Name: Cindy Singleton MRN: [id number] DOB: 1951-01-20 Today's Date: 12/13/2021  History of Present Illness  Pt is a 71 yo female s/p R TKA. PMH of smoking, HTN, DM, depression, Barrett's esophagus, back surgery,  Clinical Impression  Pt alert, agreeable to PT, reported 6/10 in R knee. Pt stated at baseline she is ambulatory with a SPC, family will be able to assist as needed at discharge.  Pt able to move all extremities against gravity, challenged by RLE SLR due to pain. Supine to sit with CGA, use of bed rails. Good sitting balance at EOB and on BSC (pericare in sitting). Sit <> stand several times during session, progressed to CGA with adequate BUE support (recliner arms), minA from low toilet and steadying from EOB. Pt very limited by nausea; unable to ambulate >26ft due to symptoms, but ambulatory with CGA and RW.  Overall the patient demonstrated deficits (see "PT Problem List") that impede the patient's functional abilities, safety, and mobility and would benefit from skilled PT intervention. Recommendation at this time is HHPT with frequent/constant supervision/assistance to maximize function and safety.       Recommendations for follow up therapy are one component of a multi-disciplinary discharge planning process, led by the attending physician.  Recommendations may be updated based on patient status, additional functional criteria and insurance authorization.  Follow Up Recommendations Home health PT      Assistance Recommended at Discharge Frequent or constant Supervision/Assistance  Patient can return home with the following  Assistance with cooking/housework;A little help with bathing/dressing/bathroom;A little help with walking and/or transfers;Assist for transportation;Help with stairs or ramp for entrance;Direct supervision/assist for medications management    Equipment Recommendations None recommended by PT  Recommendations for  Other Services       Functional Status Assessment Patient has had a recent decline in their functional status and demonstrates the ability to make significant improvements in function in a reasonable and predictable amount of time.     Precautions / Restrictions Precautions Precautions: Fall;Knee Precaution Booklet Issued: Yes (comment) Restrictions Weight Bearing Restrictions: Yes RLE Weight Bearing: Weight bearing as tolerated      Mobility  Bed Mobility Overal bed mobility: Needs Assistance Bed Mobility: Supine to Sit     Supine to sit: HOB elevated, Min guard          Transfers Overall transfer level: Needs assistance Equipment used: Rolling walker (2 wheels) Transfers: Sit to/from Stand Sit to Stand: Min assist, Min guard           General transfer comment: from standard EOB and from low commode minA, CGA from recliner    Ambulation/Gait Ambulation/Gait assistance: Min guard Gait Distance (Feet):  (three bouts of 10-34ft) Assistive device: Rolling walker (2 wheels) Gait Pattern/deviations: Antalgic, Decreased stance time - right          Stairs            Wheelchair Mobility    Modified Rankin (Stroke Patients Only)       Balance Overall balance assessment: Needs assistance Sitting-balance support: Feet supported Sitting balance-Leahy Scale: Good Sitting balance - Comments: pericare in sitting   Standing balance support: Reliant on assistive device for balance Standing balance-Leahy Scale: Fair                               Pertinent Vitals/Pain Pain Assessment Pain Assessment: 0-10 Pain Score: 6  Pain Location: R knee  pain Pain Descriptors / Indicators: Aching, Guarding, Grimacing Pain Intervention(s): Limited activity within patient's tolerance, Monitored during session, Repositioned, Ice applied    Home Living Family/patient expects to be discharged to:: Private residence Living Arrangements:  (children will be  available at discharge to assist) Available Help at Discharge: Family Type of Home: House Home Access: Level entry       Home Layout: Two level;Able to live on main level with bedroom/bathroom Home Equipment: Rolling Walker (2 wheels);BSC/3in1;Cane - single point      Prior Function Prior Level of Function : Independent/Modified Independent             Mobility Comments: SPC for ambulation       Hand Dominance        Extremity/Trunk Assessment   Upper Extremity Assessment Upper Extremity Assessment: Overall WFL for tasks assessed    Lower Extremity Assessment Lower Extremity Assessment:  (unable to fully lift RLE due to pain, LLE WFLs)       Communication   Communication: No difficulties  Cognition Arousal/Alertness: Awake/alert Behavior During Therapy: WFL for tasks assessed/performed Overall Cognitive Status: Within Functional Limits for tasks assessed                                          General Comments      Exercises     Assessment/Plan    PT Assessment Patient needs continued PT services  PT Problem List Decreased strength;Decreased mobility;Decreased activity tolerance;Decreased balance;Pain;Decreased knowledge of precautions;Decreased knowledge of use of DME;Decreased range of motion       PT Treatment Interventions DME instruction;Therapeutic exercise;Balance training;Gait training;Stair training;Neuromuscular re-education;Functional mobility training;Therapeutic activities;Patient/family education    PT Goals (Current goals can be found in the Care Plan section)  Acute Rehab PT Goals Patient Stated Goal: to go home PT Goal Formulation: With patient Time For Goal Achievement: 12/27/21 Potential to Achieve Goals: Good    Frequency BID     Co-evaluation               AM-PAC PT "6 Clicks" Mobility  Outcome Measure Help needed turning from your back to your side while in a flat bed without using bedrails?: A  Little Help needed moving from lying on your back to sitting on the side of a flat bed without using bedrails?: A Little Help needed moving to and from a bed to a chair (including a wheelchair)?: A Little Help needed standing up from a chair using your arms (e.g., wheelchair or bedside chair)?: A Little Help needed to walk in hospital room?: A Little Help needed climbing 3-5 steps with a railing? : A Lot 6 Click Score: 17    End of Session Equipment Utilized During Treatment: Gait belt Activity Tolerance: Other (comment) (limited by nausea) Patient left: in chair;with family/visitor present Nurse Communication: Mobility status PT Visit Diagnosis: Other abnormalities of gait and mobility (R26.89);Difficulty in walking, not elsewhere classified (R26.2);Muscle weakness (generalized) (M62.81);Pain Pain - Right/Left: Right Pain - part of body: Knee    Time: 1430-1502 PT Time Calculation (min) (ACUTE ONLY): 32 min   Charges:   PT Evaluation $PT Eval Low Complexity: 1 Low PT Treatments $Therapeutic Activity: 23-37 mins       Lieutenant Diego PT, DPT 3:15 PM,12/13/21

## 2021-12-13 NOTE — H&P (Signed)
History of Present Illness: Cindy Singleton is a 71 y.o.female who is here for a history and physical for an upcoming right total knee arthroplasty to be done by Dr. Roland Rack on December 13, 2021. The patient has had persistent right knee pain. The symptoms began several years ago and developed without any specific cause or injury. She is status post an arthroscopic debridement and partial medial meniscectomy performed by myself in 2016. She reports 6/10 pain. The pain is located along the lateral and medial aspect of the knee. The pain is described as aching, dull, stabbing, and throbbing. The symptoms are aggravated constantly, with normal daily activities, at higher levels of activity, rising from a chair, walking, standing, standing pivot, and activity in general. She also describes an intermittent grinding sensation in the knee. She has associated swelling and deformity. She has tried over-the-counter medications and steroid injections with temporary partial relief of her symptoms. However, the steroid injection caused her blood sugars to reach out of control for several days, so she does not wish to pursue this option again. The patient ambulates with a cane and is unable to reciprocate stairs. She also will occasionally have pain at night. The patient is a diabetic with a hemoglobin A1c of 6.4.  Current Outpatient Medications: aspirin 81 MG EC tablet Take 81 mg by mouth once daily  atorvastatin (LIPITOR) 20 MG tablet TAKE 1 TABLET BY MOUTH EVERY DAY 90 tablet 3  blood glucose diagnostic (ONETOUCH VERIO TEST STRIPS) test strip once daily 50 strip 10  carvediloL (COREG) 12.5 MG tablet TAKE 1 TABLET BY MOUTH TWICE A DAY WITH MEALS 180 tablet 3  cetirizine (ZYRTEC) 10 MG tablet Take by mouth  esomeprazole (NEXIUM) 40 MG DR capsule Take 40 mg by mouth 2 (two) times daily  FLUoxetine (PROZAC) 40 MG capsule TAKE 1 CAPSULE BY MOUTH EVERY DAY 90 capsule 3  fluticasone (FLONASE) 50 mcg/actuation nasal spray Place  1 spray into both nostrils once daily 0  losartan (COZAAR) 50 MG tablet TAKE 1 TABLET BY MOUTH EVERY DAY 90 tablet 3  metFORMIN (GLUCOPHAGE) 500 MG tablet TAKE 2 TABLETS BY MOUTH TWICE A DAY WITH MEALS 360 tablet 2  albuterol 90 mcg/actuation inhaler Inhale 2 inhalations into the lungs every 6 (six) hours as needed (cough with SOB) 1 each 1  lancets Use 1 each once daily Dispense Verio lancets. Diagnosis code E11.9 50 each 10   No current Epic-ordered facility-administered medications on file.   Allergies:  Shellfish Containing Products Itching and Swelling  Sulfa (Sulfonamide Antibiotics) Swelling, Hives, Itching and Rash  Lisinopril Headache and Other (Severe headaches)  Propoxyphene Hives, Swelling and Other (See Comments)   Past Medical History:  AHI 34/hr. Hypoxemia noted throughout study 09/09/2020  Allergic state  Arthritis 07/16/2012  Ascending aorta dilatation (CMS-HCC) 07/29/2018  3.5cm noted on ECHO in May 2020  Ascending aorta dilatation (CMS-HCC)  Barrett's esophagus  Chronic pain of right knee  Current severe episode of major depressive disorder without psychotic features without prior episode (CMS-HCC) 05/29/2017  Depression  Diabetes mellitus type 2, uncomplicated (CMS-HCC)  Dyspnea  Edema 07/16/2012  GERD (gastroesophageal reflux disease)  Heart murmur  Hyperlipidemia, mixed 08/03/2020  Hypertension  LVH (left ventricular hypertrophy) due to hypertensive disease, without heart failure  Migraines  Obesity  PONV (postoperative nausea and vomiting)  Primary osteoarthritis of right knee 05/20/2014  Stomach ulcer   Past Surgical History:  HERNIA REPAIR 2010  Arthroscopic partial medial meniscectomy and extensive arthroscopic debridement right knee Right  07/07/2014 (Dr. Joice Lofts)  COLONOSCOPY W/BIOPSY N/A 03/27/2017  Procedure: SCREENING; COLONOSCOPY; Surgeon: Dorita Fray, MD; Location: [facility]; Service: Gastroenterology; Laterality: N/A;   ESOPHAGOGASTRODOUDENOSCOPY W/BIOPSY N/A 04/10/2017  Procedure: EGD; Surgeon: Pleas Patricia, MD; Location: [facility]; Service: Gastroenterology; Laterality: N/A;  APPENDECTOMY  Back surgery  CHOLECYSTECTOMY  HYSTERECTOMY  KNEE ARTHROSCOPY Left  LAPAROSCOPIC TUBAL LIGATION  Sinus surgery   Family History:  Lung cancer Mother  Stomach cancer Father  Breast cancer Sister  Diabetes type I Daughter  Skin cancer Son  Skin cancer Son  Stage 4 melanoma  Anesthesia problems Neg Hx   Social History:   Socioeconomic History:  Marital status: Single  Tobacco Use  Smoking status: Former (Packs/day: 1.00, Years: 30.00)  Additional pack years: 0.00  Total pack years: 30.00  Types: Cigarettes  Quit date: 06/28/2004  Years since quitting: 17.4  Smokeless tobacco: Never  Vaping Use  Vaping Use: Never used  Substance and Sexual Activity  Alcohol use: Not Currently  Alcohol/week: 0.0 standard drinks of alcohol  Drug use: No  Sexual activity: Not Currently  Social History Narrative  Divorced, 1 son (advanced malignant melanoma), 2 daughters  BS degr; Retired Science writer  Former smoker 1ppd x30 years; quit ~2005  Denies tobacco/ETOH/drug use  No exercise   03/18/2020  Military Service: None  Likes/Enjoys/What fills your day?: Narrative  Home: Two Story  Your Bedrooms is on: Second Level 13 stairs with railings on one side  Fewest Steps to enter the home: 0  Other persons in the home: none  Pets: none  Medical equipment you use daily: [employer] equipment available in the home: Cane  Dental: no significant dental problems, upper dentures. Last screening Date: December 2019 Screening is: Declined  Vision: Advertising account executive. Last screening Date: March 2022 Screening is: Up to date  Hearing: Denies any issues in Hearing . No screening  Dermatology: Denies any areas of concern on skin. No screening   Social Determinants of Health:   Financial  Resource Strain: Low Risk (03/23/2021)  Overall Financial Resource Strain (CARDIA)  Difficulty of Paying Living Expenses: Not hard at all  Food Insecurity: Unknown (03/23/2021)  Hunger Vital Sign  Worried About Running Out of Food in the Last Year: Never true  Transportation Needs: No Transportation Needs (03/23/2021)  PRAPARE - Risk analyst (Medical): No  Lack of Transportation (Non-Medical): No   Review of Systems:  A comprehensive 14 point ROS was performed, reviewed, and the pertinent orthopaedic findings are documented in the HPI.  Physical Exam: Vitals:  12/06/21 1455  BP: (!) 148/81  Pulse: 76  Weight: (!) 112.7 kg (248 lb 6.4 oz)  Height: 154.9 cm (5\' 1" )  PainSc: 8  PainLoc: Knee   General/Constitutional: Pleasant significantly overweight elderly female in no acute distress. Neuro/Psych: Normal mood and affect, oriented to person, place and time. Eyes: Non-icteric. Pupils are equal, round, and reactive to light, and exhibit synchronous movement. Lymphatic: No palpable adenopathy. Respiratory: Lungs clear to auscultation, Normal chest excursion, No wheezes, and Non-labored breathing Cardiovascular: Regular rate and rhythm. No murmurs. and No edema, swelling or tenderness, except as noted in detailed exam. Vascular: No edema, swelling or tenderness, except as noted in detailed exam. Integumentary: No impressive skin lesions present, except as noted in detailed exam. Musculoskeletal: Unremarkable, except as noted in detailed exam.  Heart: Examination of the heart reveals regular, rate, and rhythm. There is no murmur noted on ascultation. There is a normal apical  pulse.  Lungs: Lungs are clear to auscultation. There is no wheeze, rhonchi, or crackles. There is normal expansion of bilateral chest walls.   Right knee exam: GAIT: antalgic and uses a cane. ALIGNMENT: moderate varus SKIN: Well-healed arthroscopic portal sites, otherwise  unremarkable. SWELLING: mild EFFUSION: small WARMTH: no warmth TENDERNESS: moderate-severe tenderness along medial joint line, moderate tenderness along lateral joint line ROM: 10 to 100 degrees with mild pain in maximal flexion McMURRAY'S: equivocal PATELLOFEMORAL: normal tracking with no peri-patellar tenderness and negative apprehension sign CREPITUS: Mild patellofemoral crepitance LACHMAN'S: negative PIVOT SHIFT: negative ANTERIOR DRAWER: negative POSTERIOR DRAWER: negative VARUS/VALGUS: positive pseudolaxity to varus stressing  She is neurovascularly intact to the right lower extremity and foot.  Knee Imaging: AP weightbearing of both knees, as well as lateral and merchant views of the right knee are obtained. These films demonstrate severe degenerative changes, primarily involving the medial compartment with 90% medial joint space narrowing. Lesser degenerative changes of the lateral more so than the patellofemoral compartments also are noted. Overall alignment is mild varus. No fractures, lytic lesions, or abnormal calcifications are noted.  Assessment:  1. Primary osteoarthritis of right knee. 2. Type 2 diabetes mellitus with stage 3a chronic kidney disease, without long-term current use of insulin.  3. Morbid obesity with BMI of 45.0-49.9, adult.   Plan: The treatment options were discussed with the patient and her daughter. In addition, patient educational materials were provided regarding the diagnosis and treatment options. The patient is quite frustrated by her symptoms and functional limitations, and is ready to consider more aggressive treatment options. Therefore, I have recommended a surgical procedure, specifically a right total knee arthroplasty. The procedure was discussed with the patient, as were the potential risks (including bleeding, infection, nerve and/or blood vessel injury, persistent or recurrent pain, loosening and/or failure of the components, dislocation,  need for further surgery, blood clots, strokes, heart attacks and/or arhythmias, pneumonia, etc.) and benefits. The patient states his/her understanding and wishes to proceed. All of the patient's questions and concerns were answered. She can call any time with further concerns. She will follow up post-surgery, routine.    H&P reviewed and patient re-examined. No changes.

## 2021-12-13 NOTE — Anesthesia Preprocedure Evaluation (Addendum)
Anesthesia Evaluation  Patient identified by MRN, date of birth, ID band Patient awake    Reviewed: Allergy & Precautions, NPO status , Patient's Chart, lab work & pertinent test results, reviewed documented beta blocker date and time   History of Anesthesia Complications (+) PONV and history of anesthetic complications  Airway Mallampati: III  TM Distance: >3 FB Neck ROM: full    Dental  (+) Chipped, Edentulous Upper   Pulmonary neg pulmonary ROS, former smoker,    Pulmonary exam normal        Cardiovascular hypertension, Pt. on medications and Pt. on home beta blockers Normal cardiovascular exam  ECHO 06/2020: INTERPRETATION  NORMAL LEFT VENTRICULAR SYSTOLIC FUNCTION  WITH MODERATE LVH  MILD RV SYSTOLIC DYSFUNCTION (See above)  TRIVIAL REGURGITATION NOTED (See above)  NO VALVULAR STENOSIS  ESTIMATED LVEF >55%  SIGMOID SEPTUM NOTED WITH BASAL SEPTAL HYPERTROPHY  NO LVOT OBSTRUCTION SEEN.  MILD DILATION OF ASCENDING AORTA.  MODERATE LEFT ATRIAL ENLARGEMENT.  MILD THICKENING OF AORTIC AND MITRAL VALVES.     Neuro/Psych negative neurological ROS  negative psych ROS   GI/Hepatic Neg liver ROS, GERD  Controlled and Medicated,  Endo/Other  diabetes  Renal/GU      Musculoskeletal   Abdominal (+) + obese,   Peds  Hematology negative hematology ROS (+)   Anesthesia Other Findings Past Medical History: No date: Ascending aorta dilatation (HCC) No date: Barrett's esophagus No date: Depression No date: Diabetes mellitus without complication (HCC) No date: Dyspnea No date: GERD (gastroesophageal reflux disease) No date: Headache     Comment:  migraines No date: Hypertension No date: LVH (left ventricular hypertrophy) due to hypertensive  disease, without heart failure No date: PONV (postoperative nausea and vomiting) No date: Stomach ulcer  Past Surgical History: No date: ABDOMINAL HYSTERECTOMY No date:  APPENDECTOMY No date: BACK SURGERY No date: CHOLECYSTECTOMY No date: HERNIA REPAIR No date: KNEE ARTHROSCOPY; Left 07/07/2014: KNEE ARTHROSCOPY WITH MEDIAL MENISECTOMY; Right     Comment:  Procedure: KNEE ARTHROSCOPY WITH MEDIAL MENISECTOMY                Right Arthroscopic debridement and partial medial               menisectomy;  Surgeon: Corky Mull, MD;  Location: [facility]               ORS;  Service: Orthopedics;  Laterality: Right; No date: NASAL SINUS SURGERY  BMI    Body Mass Index: 45.73 kg/m      Reproductive/Obstetrics negative OB ROS                            Anesthesia Physical Anesthesia Plan  ASA: 3  Anesthesia Plan: Spinal   Post-op Pain Management:    Induction:   PONV Risk Score and Plan:   Airway Management Planned: Natural Airway and Simple Face Mask  Additional Equipment:   Intra-op Plan:   Post-operative Plan:   Informed Consent: I have reviewed the patients History and Physical, chart, labs and discussed the procedure including the risks, benefits and alternatives for the proposed anesthesia with the patient or authorized representative who has indicated his/her understanding and acceptance.     Dental Advisory Given  Plan Discussed with: Anesthesiologist, CRNA and Surgeon  Anesthesia Plan Comments:        Anesthesia Quick Evaluation

## 2021-12-13 NOTE — Anesthesia Procedure Notes (Signed)
Spinal  Patient location during procedure: OR Start time: 12/13/2021 7:48 AM End time: 12/13/2021 7:51 AM Reason for block: surgical anesthesia Staffing Performed: other anesthesia staff  Anesthesiologist: Iran Ouch, MD Resident/CRNA: Esaw Grandchild, CRNA Other anesthesia staff: Everardo Pacific, RN Performed by: Esaw Grandchild, CRNA Authorized by: Iran Ouch, MD   Preanesthetic Checklist Completed: patient identified, IV checked, site marked, risks and benefits discussed, surgical consent, monitors and equipment checked, pre-op evaluation and timeout performed Spinal Block Patient position: sitting Prep: ChloraPrep Patient monitoring: heart rate, cardiac monitor, continuous pulse ox and blood pressure Approach: midline Location: L4-5 Injection technique: single-shot Needle Needle type: Sprotte  Needle gauge: 24 G Needle length: 9 cm Assessment Events: CSF return Additional Notes Performed by Sunday Shams, SRNA

## 2021-12-13 NOTE — Op Note (Signed)
12/13/2021  10:30 AM  Patient:   Cindy Singleton  Pre-Op Diagnosis:   Degenerative joint disease, right knee.  Post-Op Diagnosis:   Same  Procedure:   Right TKA using all-cemented Biomet Vanguard system with a 65 mm PCR femur, a 67 mm tibial tray with a 16 mm anterior stabilized E-poly insert, and a 31 x 6.2 mm all-poly 3-pegged domed patella.  Surgeon:   Pascal Lux, MD  Assistant:   Donnie Mesa, RNFA  Anesthesia:   Spinal  Findings:   As above  Complications:   None  EBL:   10 cc  Fluids:   800 cc crystalloid  UOP:   None  TT:   110 minutes at 300 mmHg  Drains:   None  Closure:   Staples  Implants:   As above  Brief Clinical Note:   The patient is a 71 year old female with a long history of progressively worsening right knee pain. The patient's symptoms have progressed despite medications, activity modification, injections, etc. The patient's history and examination were consistent with advanced degenerative joint disease of the right knee confirmed by plain radiographs. The patient presents at this time for a right total knee arthroplasty.  Procedure:   The patient was brought into the operating room. After adequate spinal anesthesia was obtained, the patient was lain in the supine position before the right lower extremity was prepped with ChloraPrep solution and draped sterilely. Preoperative antibiotics were administered. A timeout was performed to verify the appropriate surgical site before the limb was exsanguinated with an Esmarch and the tourniquet inflated to 300 mmHg.   A standard anterior approach to the knee was made through an approximately 7 inch incision. The incision was carried down through the subcutaneous tissues to expose superficial retinaculum. This was split the length of the incision and the medial flap elevated sufficiently to expose the medial retinaculum. The medial retinaculum was incised, leaving a 3-4 mm cuff of tissue on the patella. This  was extended distally along the medial border of the patellar tendon and proximally through the medial third of the quadriceps tendon. A subtotal fat pad excision was performed before the soft tissues were elevated off the anteromedial and anterolateral aspects of the proximal tibia to the level of the collateral ligaments. The anterior portions of the medial and lateral menisci were removed, as was the anterior cruciate ligament. With the knee flexed to 90, the external tibial guide was positioned and the appropriate proximal tibial cut made. This piece was taken to the back table where it was measured and found to be optimally replicated by a 67 mm component.  Attention was directed to the distal femur. The intramedullary canal was accessed through a 3/8" drill hole. The intramedullary guide was inserted and positioned in order to obtain a neutral flexion gap. The intercondylar block was positioned with care taken to avoid notching the anterior cortex of the femur. The appropriate cut was made. Next, the distal cutting block was placed at 5 of valgus alignment. Using the 9 mm slot, the distal cut was made. The distal femur was measured and found to be optimally replicated by the 65 mm component. The 65 mm 4-in-1 cutting block was positioned and first the posterior, then the posterior chamfer, the anterior chamfer, and finally the anterior cuts were made. At this point, the posterior portions medial and lateral menisci were removed. A trial reduction was performed using the appropriate femoral and tibial components with first the 10 mm insert, the  12 mm insert, the 14 mm insert, then finally the 16 mm insert. The 16 mm insert demonstrated excellent stability to varus and valgus stressing both in flexion and extension while permitting full extension. Patella tracking was assessed and found to be excellent. Therefore, the tibial guide position was marked on the proximal tibia. The patella thickness was measured  and found to be 18 mm. Therefore, the appropriate cut was made. The patellar surface was measured and found to be optimally replicated by the 31 mm component. The three peg holes were drilled in place before the trial button was inserted. Patella tracking was assessed and found to be excellent, passing the "no thumb test". The lug holes were drilled into the distal femur before the trial component was removed, leaving only the tibial tray. The keel was then created using the appropriate tower, reamer, and punch.  The bony surfaces were prepared for cementing by irrigating them thoroughly with sterile saline solution via the jet lavage system. A bone plug was fashioned from some of the bone that had been removed previously and used to plug the distal femoral canal. In addition, 20 cc of Exparel diluted out to 60 cc with normal saline and 30 cc of 0.5% Sensorcaine were injected into the postero-medial and postero-lateral aspects of the knee, the medial and lateral gutter regions, and the peri-incisional tissues to help with postoperative analgesia. Meanwhile, the cement was being mixed on the back table. When it was ready, the tibial tray was cemented in first. The excess cement was removed using Personal assistant. Next, the femoral component was impacted into place. Again, the excess cement was removed using Personal assistant. The 16 mm trial insert was positioned and the knee brought into extension while the cement hardened. Finally, the patella was cemented into place and secured using the patellar clamp. Again, the excess cement was removed using Personal assistant. Once the cement had hardened, the knee was placed through a range of motion with the findings as described above. Therefore, the trial insert was removed and, after verifying that no cement had been retained posteriorly, the permanent 16 mm anterior stabilized E-polyethylene insert was positioned and secured using the appropriate key locking mechanism.  Again the knee was placed through a range of motion with the findings as described above.  The wound was copiously irrigated with sterile saline solution using the jet lavage system before the quadriceps tendon and retinacular layer were reapproximated using #0 Vicryl interrupted sutures. The superficial retinacular layer also was closed using a running #0 Vicryl suture. A total of 10 cc of transexemic acid (TXA) was injected intra-articularly before the subcutaneous tissues were closed in several layers using 2-0 Vicryl interrupted sutures. The skin was closed using staples. A sterile honeycomb dressing was applied to the skin before the leg was wrapped with an Ace wrap to accommodate the Polar Care device. The patient was then awakened and returned to the recovery room in satisfactory condition after tolerating the procedure well.

## 2021-12-13 NOTE — Anesthesia Procedure Notes (Signed)
Procedure Name: MAC Date/Time: 12/13/2021 7:50 AM  Performed by: Esaw Grandchild, CRNAPre-anesthesia Checklist: Patient identified, Emergency Drugs available, Suction available and Patient being monitored Oxygen Delivery Method: Simple face mask

## 2021-12-13 NOTE — TOC Progression Note (Signed)
Transition of Care [facility]) - Progression Note    Patient Details  Name: Cindy Singleton MRN: [id number] Date of Birth: 07/19/1950  Transition of Care [facility]) CM/SW [place], RN Phone Number: 12/13/2021, 3:11 PM  Clinical Narrative:    The patient is set up with [facility] for [facility] services, No DME needed        Expected Discharge Plan and Services           Expected Discharge Date: 12/13/21                                     Social Determinants of Health (SDOH) Interventions    Readmission Risk Interventions     No data to display

## 2021-12-14 ENCOUNTER — Encounter: Payer: Self-pay | Admitting: Surgery

## 2021-12-14 DIAGNOSIS — M1711 Unilateral primary osteoarthritis, right knee: Secondary | ICD-10-CM | POA: Diagnosis not present

## 2021-12-14 LAB — CBC
HCT: 30.9 % — ABNORMAL LOW (ref 36.0–46.0)
Hemoglobin: 10 g/dL — ABNORMAL LOW (ref 12.0–15.0)
MCH: 28.7 pg (ref 26.0–34.0)
MCHC: 32.4 g/dL (ref 30.0–36.0)
MCV: 88.8 fL (ref 80.0–100.0)
Platelets: 224 10*3/uL (ref 150–400)
RBC: 3.48 MIL/uL — ABNORMAL LOW (ref 3.87–5.11)
RDW: 13.7 % (ref 11.5–15.5)
WBC: 12.1 10*3/uL — ABNORMAL HIGH (ref 4.0–10.5)
nRBC: 0 % (ref 0.0–0.2)

## 2021-12-14 LAB — GLUCOSE, CAPILLARY: Glucose-Capillary: 197 mg/dL — ABNORMAL HIGH (ref 70–99)

## 2021-12-14 LAB — BASIC METABOLIC PANEL
Anion gap: 5 (ref 5–15)
BUN: 28 mg/dL — ABNORMAL HIGH (ref 8–23)
CO2: 19 mmol/L — ABNORMAL LOW (ref 22–32)
Calcium: 8.5 mg/dL — ABNORMAL LOW (ref 8.9–10.3)
Chloride: 113 mmol/L — ABNORMAL HIGH (ref 98–111)
Creatinine, Ser: 1.4 mg/dL — ABNORMAL HIGH (ref 0.44–1.00)
GFR, Estimated: 40 mL/min — ABNORMAL LOW (ref >60)
Glucose, Bld: 234 mg/dL — ABNORMAL HIGH (ref 70–99)
Potassium: 4.3 mmol/L (ref 3.5–5.1)
Sodium: 137 mmol/L (ref 135–145)

## 2021-12-14 MED ORDER — ACETAMINOPHEN 500 MG PO TABS
ORAL_TABLET | ORAL | Status: AC
  Filled 2021-12-14: qty 2

## 2021-12-14 MED ORDER — APIXABAN 2.5 MG PO TABS
ORAL_TABLET | ORAL | Status: AC
  Administered 2021-12-14: 2.5 mg via ORAL
  Filled 2021-12-14: qty 1

## 2021-12-14 MED ORDER — KETOROLAC TROMETHAMINE 15 MG/ML IJ SOLN
INTRAMUSCULAR | Status: AC
  Filled 2021-12-14: qty 1

## 2021-12-14 MED ORDER — PANTOPRAZOLE SODIUM 40 MG PO TBEC
DELAYED_RELEASE_TABLET | ORAL | Status: AC
  Administered 2021-12-14: 40 mg via ORAL
  Filled 2021-12-14: qty 1

## 2021-12-14 MED ORDER — CEFAZOLIN SODIUM-DEXTROSE 2-4 GM/100ML-% IV SOLN
INTRAVENOUS | Status: AC
  Filled 2021-12-14: qty 100

## 2021-12-14 MED ORDER — LORATADINE 10 MG PO TABS
ORAL_TABLET | ORAL | Status: AC
  Administered 2021-12-14: 10 mg via ORAL
  Filled 2021-12-14: qty 1

## 2021-12-14 MED ORDER — DOCUSATE SODIUM 100 MG PO CAPS
ORAL_CAPSULE | ORAL | Status: AC
  Administered 2021-12-14: 100 mg via ORAL
  Filled 2021-12-14: qty 1

## 2021-12-14 MED ORDER — INSULIN ASPART 100 UNIT/ML IJ SOLN
INTRAMUSCULAR | Status: AC
  Administered 2021-12-14: 3 [IU] via SUBCUTANEOUS
  Filled 2021-12-14: qty 1

## 2021-12-14 MED ORDER — CARVEDILOL 12.5 MG PO TABS
ORAL_TABLET | ORAL | Status: AC
  Filled 2021-12-14: qty 1

## 2021-12-14 MED ORDER — METFORMIN HCL ER 500 MG PO TB24
ORAL_TABLET | ORAL | Status: AC
  Administered 2021-12-14: 500 mg
  Filled 2021-12-14: qty 2

## 2021-12-14 MED ORDER — ONDANSETRON HCL 4 MG PO TABS: 4.0000 mg | ORAL_TABLET | Freq: Four times a day (QID) | ORAL | 0 refills | Status: DC | PRN

## 2021-12-14 NOTE — Progress Notes (Signed)
Physical Therapy Treatment Patient Details Name: Cindy Singleton MRN: [id number] DOB: October 12, 1950 Today's Date: 12/14/2021   History of Present Illness Pt is a 71 yo female s/p R TKA. PMH of smoking, HTN, DM, depression, Barrett's esophagus, back surgery,    PT Comments    Patient alert, agreeable to PT, reported R knee pain as 1/10. The patient demonstrated great progress towards goals and with mobility this session. Bed mobility with supervision, sit <> stand twice during session with RW and supervision as well, safe hand placement noted. She ambulated ~129ft with RW CGA-supervision, wide BOS support noted with decreased RLE weight bearing but no LOB, no complaints of increased pain. Stair navigation performed with CGA and pt able to demonstrate and verbalize safe technique. The patient would benefit from further skilled PT intervention to continue to progress towards goals. Recommendation remains appropriate.     Recommendations for follow up therapy are one component of a multi-disciplinary discharge planning process, led by the attending physician.  Recommendations may be updated based on patient status, additional functional criteria and insurance authorization.  Follow Up Recommendations  Home health PT     Assistance Recommended at Discharge Frequent or constant Supervision/Assistance  Patient can return home with the following Assistance with cooking/housework;A little help with bathing/dressing/bathroom;A little help with walking and/or transfers;Assist for transportation;Help with stairs or ramp for entrance;Direct supervision/assist for medications management   Equipment Recommendations  None recommended by PT    Recommendations for Other Services       Precautions / Restrictions Precautions Precautions: Fall;Knee Precaution Booklet Issued: Yes (comment) Restrictions Weight Bearing Restrictions: Yes RLE Weight Bearing: Weight bearing as tolerated     Mobility  Bed  Mobility Overal bed mobility: Needs Assistance Bed Mobility: Supine to Sit     Supine to sit: Supervision          Transfers Overall transfer level: Needs assistance Equipment used: Rolling walker (2 wheels) Transfers: Sit to/from Stand Sit to Stand: Supervision           General transfer comment: from low bed height and recliner, supervision    Ambulation/Gait Ambulation/Gait assistance: Supervision, Min guard Gait Distance (Feet): 150 Feet Assistive device: Rolling walker (2 wheels)         General Gait Details: wide BOS, decreased weight bearing on RLE   Stairs Stairs: Yes Stairs assistance: Min guard Stair Management: One rail Left, Step to pattern Number of Stairs: 4 General stair comments: verbalized and demonstrated safe technique   Wheelchair Mobility    Modified Rankin (Stroke Patients Only)       Balance Overall balance assessment: Needs assistance Sitting-balance support: Feet supported Sitting balance-Leahy Scale: Good     Standing balance support: Reliant on assistive device for balance Standing balance-Leahy Scale: Fair                              Cognition Arousal/Alertness: Awake/alert Behavior During Therapy: WFL for tasks assessed/performed Overall Cognitive Status: Within Functional Limits for tasks assessed                                          Exercises Total Joint Exercises Ankle Circles/Pumps: AROM, Both, 10 reps Knee Flexion: AROM, Strengthening, Right, 10 reps Goniometric ROM: 0-87 degrees    General Comments        Pertinent Vitals/Pain Pain  Assessment Pain Assessment: 0-10 Pain Score: 1  Pain Location: R knee Pain Descriptors / Indicators: Sore Pain Intervention(s): Limited activity within patient's tolerance, Monitored during session, Ice applied, Repositioned    Home Living                          Prior Function            PT Goals (current goals can  now be found in the care plan section) Progress towards PT goals: Progressing toward goals    Frequency    BID      PT Plan Current plan remains appropriate    Co-evaluation              AM-PAC PT "6 Clicks" Mobility   Outcome Measure  Help needed turning from your back to your side while in a flat bed without using bedrails?: A Little Help needed moving from lying on your back to sitting on the side of a flat bed without using bedrails?: A Little Help needed moving to and from a bed to a chair (including a wheelchair)?: A Little Help needed standing up from a chair using your arms (e.g., wheelchair or bedside chair)?: A Little Help needed to walk in hospital room?: A Little Help needed climbing 3-5 steps with a railing? : A Little 6 Click Score: 18    End of Session Equipment Utilized During Treatment: Gait belt Activity Tolerance: Patient tolerated treatment well Patient left: in chair;with call bell/phone within reach Nurse Communication: Mobility status PT Visit Diagnosis: Other abnormalities of gait and mobility (R26.89);Difficulty in walking, not elsewhere classified (R26.2);Muscle weakness (generalized) (M62.81);Pain Pain - Right/Left: Right Pain - part of body: Knee     Time: 9758-8325 PT Time Calculation (min) (ACUTE ONLY): 23 min  Charges:  $Gait Training: 8-22 mins $Therapeutic Activity: 8-22 mins                     Lieutenant Diego PT, DPT 9:19 AM,12/14/21

## 2021-12-14 NOTE — Progress Notes (Signed)
Patient was discharged to home. No signs of acute distress. Patient discharge instructions given to patient by staff RN. Questions answered. Patient left in stable condition.

## 2021-12-14 NOTE — Progress Notes (Signed)
  Subjective: 1 Day Post-Op Procedure(s) (LRB): TOTAL KNEE ARTHROPLASTY (Right) Patient reports pain as mild.   Patient is well, and has had no acute complaints or problems Plan is to go Home after hospital stay. Negative for chest pain and shortness of breath Fever: no Gastrointestinal:Negative for nausea and vomiting, did have N/V yesterday while working with therapy but this has resolved. Patient is passing gas without pain this morning.  Objective: Vital signs in last 24 hours: Temp:  [97.4 F (36.3 C)-98.4 F (36.9 C)] 98.3 F (36.8 C) (10/18 0749) Pulse Rate:  [56-73] 73 (10/18 0749) Resp:  [14-20] 19 (10/18 0749) BP: (123-152)/(57-84) 139/57 (10/18 0749) SpO2:  [93 %-98 %] 95 % (10/18 0749)  Intake/Output from previous day:  Intake/Output Summary (Last 24 hours) at 12/14/2021 0812 Last data filed at 12/13/2021 1800 Gross per 24 hour  Intake 825 ml  Output 450 ml  Net 375 ml    Intake/Output this shift: No intake/output data recorded.  Labs: Recent Labs    12/14/21 0556  HGB 10.0*   Recent Labs    12/14/21 0556  WBC 12.1*  RBC 3.48*  HCT 30.9*  PLT 224   Recent Labs    12/14/21 0556  NA 137  K 4.3  CL 113*  CO2 19*  BUN 28*  CREATININE 1.40*  GLUCOSE 234*  CALCIUM 8.5*   No results for input(s): "LABPT", "INR" in the last 72 hours.   EXAM General - Patient is Alert, Appropriate, and Oriented Extremity - ABD soft Neurovascular intact Dorsiflexion/Plantar flexion intact Incision: scant drainage No cellulitis present Compartment soft Dressing/Incision - minimal bloody drainage noted to the right leg honeycomb dressing. Motor Function - intact, moving foot and toes well on exam.  Abdomen soft with intact bowel sounds.  Past Medical History:  Diagnosis Date   Ascending aorta dilatation (HCC)    Barrett's esophagus    Depression    Diabetes mellitus without complication (HCC)    Dyspnea    GERD (gastroesophageal reflux disease)     Headache    migraines   Hypertension    LVH (left ventricular hypertrophy) due to hypertensive disease, without heart failure    PONV (postoperative nausea and vomiting)    Stomach ulcer     Assessment/Plan: 1 Day Post-Op Procedure(s) (LRB): TOTAL KNEE ARTHROPLASTY (Right) Principal Problem:   Status post total knee replacement using cement, right  Estimated body mass index is 45.73 kg/m as calculated from the following:   Height as of this encounter: 5\' 1"  (1.549 m).   Weight as of this encounter: 109.8 kg. Advance diet Up with therapy D/C IV fluids when tolerating po intake.  Labs reviewed this AM. WBC 12.1, Hg 10.0.  Vitals are stable. Up with therapy this morning.  Needs to complete stair training prior to discharge home. Patient is passing gas without pain. Plan for discharge home this afternoon pending progress with PT.  DVT Prophylaxis - TED hose and Eliquis Weight-Bearing as tolerated to right leg  J. Cameron Proud, PA-C [facility] 12/14/2021, 8:12 AM

## 2021-12-14 NOTE — Discharge Summary (Signed)
Physician Discharge Summary  Patient ID: Cindy Singleton MRN: [id number] DOB/AGE: [birth year] 71 y.o.  Admit date: 12/13/2021 Discharge date: 12/14/2021  Admission Diagnoses:  Status post total knee replacement using cement, right [Z96.651] Right knee degenerative joint disease  Discharge Diagnoses: Patient Active Problem List   Diagnosis Date Noted   Status post total knee replacement using cement, right 12/13/2021    Past Medical History:  Diagnosis Date   Ascending aorta dilatation (HCC)    Barrett's esophagus    Depression    Diabetes mellitus without complication (HCC)    Dyspnea    GERD (gastroesophageal reflux disease)    Headache    migraines   Hypertension    LVH (left ventricular hypertrophy) due to hypertensive disease, without heart failure    PONV (postoperative nausea and vomiting)    Stomach ulcer      Transfusion: None.   Consultants (if any):   Discharged Condition: Improved  Hospital Course: Cindy Singleton is an 71 y.o. female who was admitted 12/13/2021 with a diagnosis of right knee degenerative joint disease and went to the operating room on 12/13/2021 and underwent the above named procedures.    Surgeries: Procedure(s): TOTAL KNEE ARTHROPLASTY on 12/13/2021 Patient tolerated the surgery well. Taken to PACU where she was stabilized and then transferred to the orthopedic floor.  Started on Eliquis 2.5mg  every 12 hrs. Heels elevated on bed with rolled towels. No evidence of DVT. Negative Homan. Physical therapy started on day #1 for gait training and transfer. OT started day #1 for ADL and assisted devices.  Patient's IV was removed on POD1.  Implants: Right TKA using all-cemented [employer] Vanguard system with a 65 mm PCR femur, a 67 mm tibial tray with a 16 mm anterior stabilized E-poly insert, and a 31 x 6.2 mm all-poly 3-pegged domed patella.    She was given perioperative antibiotics:  Anti-infectives (From admission, onward)    Start      Dose/Rate Route Frequency Ordered Stop   12/14/21 0316  ceFAZolin (ANCEF) 2-4 GM/100ML-% IVPB       Note to Pharmacy: Sylvester Harder P: cabinet override      12/14/21 0316 12/14/21 0230   12/13/21 1400  ceFAZolin (ANCEF) IVPB 2g/100 mL premix        2 g 200 mL/hr over 30 Minutes Intravenous Every 6 hours 12/13/21 1149 12/14/21 0300   12/13/21 0628  ceFAZolin (ANCEF) 2-4 GM/100ML-% IVPB       Note to Pharmacy: Rutherford Nail E: cabinet override      12/13/21 0628 12/13/21 0757   12/13/21 0600  ceFAZolin (ANCEF) IVPB 2g/100 mL premix        2 g 200 mL/hr over 30 Minutes Intravenous On call to O.R. 12/13/21 5170 12/13/21 0754     .  She was given sequential compression devices, early ambulation, and Eliquis for DVT prophylaxis.  She benefited maximally from the hospital stay and there were no complications.    Recent vital signs:  Vitals:   12/13/21 1956 12/14/21 0749  BP: (!) 140/63 (!) 139/57  Pulse: 67 73  Resp: 14 19  Temp: (!) 97.5 F (36.4 C) 98.3 F (36.8 C)  SpO2: 94% 95%    Recent laboratory studies:  Lab Results  Component Value Date   HGB 10.0 (L) 12/14/2021   HGB 11.4 (L) 12/02/2021   HGB 12.4 11/23/2014   Lab Results  Component Value Date   WBC 12.1 (H) 12/14/2021   PLT 224 12/14/2021  No results found for: "INR" Lab Results  Component Value Date   NA 137 12/14/2021   K 4.3 12/14/2021   CL 113 (H) 12/14/2021   CO2 19 (L) 12/14/2021   BUN 28 (H) 12/14/2021   CREATININE 1.40 (H) 12/14/2021   GLUCOSE 234 (H) 12/14/2021    Discharge Medications:   Allergies as of 12/14/2021       Reactions   Shellfish-derived Products Anaphylaxis   Sulfa Antibiotics Hives, Itching, Rash   Darvon [propoxyphene] Other (See Comments)   Lisinopril Other (See Comments)   Severe headaches        Medication List     STOP taking these medications    aspirin EC 81 MG tablet       TAKE these medications    acetaminophen 325 MG tablet Commonly known as:  TYLENOL Take 650 mg by mouth every 6 (six) hours as needed for mild pain, moderate pain or headache.   apixaban 2.5 MG Tabs tablet Commonly known as: Eliquis Take 1 tablet (2.5 mg total) by mouth 2 (two) times daily.   atorvastatin 20 MG tablet Commonly known as: LIPITOR Take 20 mg by mouth daily.   carvedilol 12.5 MG tablet Commonly known as: COREG Take 12.5 mg by mouth 2 (two) times daily with a meal.   cetirizine 10 MG tablet Commonly known as: ZYRTEC Take 10 mg by mouth daily as needed.   FLUoxetine 40 MG capsule Commonly known as: PROZAC Take 40 mg by mouth daily.   fluticasone 50 MCG/ACT nasal spray Commonly known as: FLONASE Place 2 sprays into both nostrils daily as needed for allergies or rhinitis.   losartan 50 MG tablet Commonly known as: COZAAR Take 50 mg by mouth daily.   metFORMIN 500 MG tablet Commonly known as: GLUCOPHAGE Take 500-1,000 mg by mouth 2 (two) times daily. 1000 mg with breakfast, 500 mg with dinner   omeprazole 20 MG capsule Commonly known as: PRILOSEC Take 20 mg by mouth 2 (two) times daily before a meal.   ondansetron 4 MG tablet Commonly known as: ZOFRAN Take 1 tablet (4 mg total) by mouth every 6 (six) hours as needed for nausea.   oxyCODONE 5 MG immediate release tablet Commonly known as: Roxicodone Take 1-2 tablets (5-10 mg total) by mouth every 4 (four) hours as needed for moderate pain or severe pain.        Diagnostic Studies: DG Knee Right Port  Result Date: 12/13/2021 CLINICAL DATA:  Status post total right knee arthroplasty. EXAM: PORTABLE RIGHT KNEE - 1-2 VIEW COMPARISON:  MRI right knee 06/06/2014 FINDINGS: Interval total right knee arthroplasty. No perihardware lucency is seen to indicate hardware failure or loosening. Expected postoperative changes including moderate joint effusion and both intra-articular and anterior subcutaneous air. Anterior surgical skin staples. No acute fracture or dislocation. IMPRESSION:  Interval total right knee arthroplasty without evidence of hardware failure. Electronically Signed   By: Yvonne Kendall M.D.   On: 12/13/2021 10:55    Disposition: Plan for discharge home today with HHPT pending progress with PT.   Follow-up Information     Lattie Corns, PA-C Follow up in 14 day(s).   Specialty: Physician Assistant Contact information: [place] [address] [phone]                Signed: Judson Roch PA-C 12/14/2021, 8:29 AM

## 2021-12-21 ENCOUNTER — Encounter: Payer: Self-pay | Admitting: Surgery

## 2022-05-04 ENCOUNTER — Other Ambulatory Visit: Payer: Self-pay | Admitting: Physician Assistant

## 2022-05-04 DIAGNOSIS — R1031 Right lower quadrant pain: Secondary | ICD-10-CM

## 2022-05-04 DIAGNOSIS — R109 Unspecified abdominal pain: Secondary | ICD-10-CM

## 2022-05-05 ENCOUNTER — Ambulatory Visit
Admission: RE | Admit: 2022-05-05 | Discharge: 2022-05-05 | Disposition: A | Discharge status: Home / Self Care | Payer: Medicare HMO | Source: Ambulatory Visit | Attending: Physician Assistant | Admitting: Physician Assistant

## 2022-05-05 DIAGNOSIS — R109 Unspecified abdominal pain: Secondary | ICD-10-CM

## 2022-05-05 DIAGNOSIS — R1031 Right lower quadrant pain: Secondary | ICD-10-CM

## 2022-09-08 ENCOUNTER — Other Ambulatory Visit: Payer: Self-pay | Admitting: Physician Assistant

## 2022-09-08 DIAGNOSIS — Z1231 Encounter for screening mammogram for malignant neoplasm of breast: Secondary | ICD-10-CM

## 2022-10-03 ENCOUNTER — Ambulatory Visit
Admission: RE | Admit: 2022-10-03 | Discharge: 2022-10-03 | Disposition: A | Discharge status: Home / Self Care | Payer: Medicare HMO | Source: Ambulatory Visit | Attending: Physician Assistant | Admitting: Physician Assistant

## 2022-10-03 DIAGNOSIS — Z1231 Encounter for screening mammogram for malignant neoplasm of breast: Secondary | ICD-10-CM | POA: Insufficient documentation

## 2023-06-28 ENCOUNTER — Other Ambulatory Visit: Payer: Self-pay | Admitting: Surgery

## 2023-07-09 ENCOUNTER — Encounter
Admission: RE | Admit: 2023-07-09 | Discharge: 2023-07-09 | Disposition: A | Discharge status: Home / Self Care | Source: Ambulatory Visit | Attending: Surgery | Admitting: Surgery

## 2023-07-09 ENCOUNTER — Other Ambulatory Visit: Payer: Self-pay

## 2023-07-09 DIAGNOSIS — Z01818 Encounter for other preprocedural examination: Secondary | ICD-10-CM | POA: Diagnosis not present

## 2023-07-09 DIAGNOSIS — Z01812 Encounter for preprocedural laboratory examination: Secondary | ICD-10-CM | POA: Diagnosis present

## 2023-07-09 DIAGNOSIS — Z01810 Encounter for preprocedural cardiovascular examination: Secondary | ICD-10-CM | POA: Diagnosis present

## 2023-07-09 HISTORY — DX: Obstructive sleep apnea (adult) (pediatric): G47.33

## 2023-07-09 HISTORY — DX: Migraine, unspecified, not intractable, without status migrainosus: G43.909

## 2023-07-09 HISTORY — DX: Other forms of dyspnea: R06.09

## 2023-07-09 HISTORY — DX: Unilateral primary osteoarthritis, left knee: M17.12

## 2023-07-09 HISTORY — DX: Cardiac murmur, unspecified: R01.1

## 2023-07-09 HISTORY — DX: Type 2 diabetes mellitus without complications: E11.9

## 2023-07-09 HISTORY — DX: Obesity, unspecified: E66.9

## 2023-07-09 HISTORY — DX: Hyperlipidemia, unspecified: E78.5

## 2023-07-09 LAB — COMPREHENSIVE METABOLIC PANEL WITH GFR
ALT: 15 U/L (ref 0–44)
AST: 18 U/L (ref 15–41)
Albumin: 3.4 g/dL — ABNORMAL LOW (ref 3.5–5.0)
Alkaline Phosphatase: 76 U/L (ref 38–126)
Anion gap: 8 (ref 5–15)
BUN: 21 mg/dL (ref 8–23)
CO2: 25 mmol/L (ref 22–32)
Calcium: 9.2 mg/dL (ref 8.9–10.3)
Chloride: 105 mmol/L (ref 98–111)
Creatinine, Ser: 1.27 mg/dL — ABNORMAL HIGH (ref 0.44–1.00)
GFR, Estimated: 44 mL/min — ABNORMAL LOW (ref >60)
Glucose, Bld: 119 mg/dL — ABNORMAL HIGH (ref 70–99)
Potassium: 3.9 mmol/L (ref 3.5–5.1)
Sodium: 138 mmol/L (ref 135–145)
Total Bilirubin: 0.6 mg/dL (ref 0.0–1.2)
Total Protein: 7 g/dL (ref 6.5–8.1)

## 2023-07-09 LAB — CBC WITH DIFFERENTIAL/PLATELET
Abs Immature Granulocytes: 0.05 10*3/uL (ref 0.00–0.07)
Basophils Absolute: 0.1 10*3/uL (ref 0.0–0.1)
Basophils Relative: 1 %
Eosinophils Absolute: 0.2 10*3/uL (ref 0.0–0.5)
Eosinophils Relative: 3 %
HCT: 34.4 % — ABNORMAL LOW (ref 36.0–46.0)
Hemoglobin: 10.9 g/dL — ABNORMAL LOW (ref 12.0–15.0)
Immature Granulocytes: 1 %
Lymphocytes Relative: 20 %
Lymphs Abs: 1.5 10*3/uL (ref 0.7–4.0)
MCH: 27.9 pg (ref 26.0–34.0)
MCHC: 31.7 g/dL (ref 30.0–36.0)
MCV: 88 fL (ref 80.0–100.0)
Monocytes Absolute: 0.7 10*3/uL (ref 0.1–1.0)
Monocytes Relative: 9 %
Neutro Abs: 5.2 10*3/uL (ref 1.7–7.7)
Neutrophils Relative %: 66 %
Platelets: 323 10*3/uL (ref 150–400)
RBC: 3.91 MIL/uL (ref 3.87–5.11)
RDW: 14.6 % (ref 11.5–15.5)
WBC: 7.8 10*3/uL (ref 4.0–10.5)
nRBC: 0 % (ref 0.0–0.2)

## 2023-07-09 LAB — URINALYSIS, ROUTINE W REFLEX MICROSCOPIC
Bilirubin Urine: NEGATIVE
Glucose, UA: NEGATIVE mg/dL
Hgb urine dipstick: NEGATIVE
Ketones, ur: NEGATIVE mg/dL
Nitrite: NEGATIVE
Protein, ur: NEGATIVE mg/dL
Specific Gravity, Urine: 1.019 (ref 1.005–1.030)
pH: 5 (ref 5.0–8.0)

## 2023-07-09 LAB — SURGICAL PCR SCREEN
MRSA, PCR: NEGATIVE
Staphylococcus aureus: NEGATIVE

## 2023-07-09 NOTE — Patient Instructions (Addendum)
 Your procedure is scheduled on:07-17-23 Tuesday Report to the Registration Desk on the 1st floor of the [facility].Then proceed to the 2nd floor Surgery Desk To find out your arrival time, please call [phone] between 1PM - 3PM on:07-16-23 Monday If your arrival time is 6:00 am, do not arrive before that time as the [facility] entrance doors do not open until 6:00 am.  REMEMBER: Instructions that are not followed completely may result in serious medical risk, up to and including death; or upon the discretion of your surgeon and anesthesiologist your surgery may need to be rescheduled.  Do not eat food after midnight the night before surgery.  No gum chewing or hard candies.  You may however, drink Water up to 2 hours before you are scheduled to arrive for your surgery. Do not drink anything within 2 hours of your scheduled arrival time  In addition, your doctor has ordered for you to drink the provided:  Gatorade G2 Drinking this carbohydrate drink up to two hours before surgery helps to reduce insulin  resistance and improve patient outcomes. Please complete drinking 2 hours before scheduled arrival time.  One week prior to surgery:Stop NOW (07-09-23) Stop Anti-inflammatories (NSAIDS) such as Advil, Aleve, Ibuprofen, Motrin, Naproxen, Naprosyn and Aspirin based products such as Excedrin, Goody's Powder, BC Powder. Stop ANY OVER THE COUNTER supplements until after surgery.  You may however, continue to take Tylenol  if needed for pain up until the day of surgery.  Stop MOUNJARO 7 days prior to surgery-Do NOT take again until AFTER surgery  Stop metFORMIN  (GLUCOPHAGE ) 2 days prior to surgery-Last dose will be on 07-14-23 Saturday  Continue taking all of your other prescription medications up until the day of surgery.  ON THE DAY OF SURGERY ONLY TAKE THESE MEDICATIONS WITH SIPS OF WATER: -carvedilol  (COREG )  -FLUoxetine  (PROZAC )  -omeprazole (PRILOSEC)  -atorvastatin  (LIPITOR)    Continue your 81 mg Aspirin up until the day prior to surgery-Do NOT take the morning of surgery  No Alcohol for 24 hours before or after surgery.  No Smoking including e-cigarettes for 24 hours before surgery.  No chewable tobacco products for at least 6 hours before surgery.  No nicotine patches on the day of surgery.  Do not use any "recreational" drugs for at least a week (preferably 2 weeks) before your surgery.  Please be advised that the combination of cocaine and anesthesia may have negative outcomes, up to and including death. If you test positive for cocaine, your surgery will be cancelled.  On the morning of surgery brush your teeth with toothpaste and water, you may rinse your mouth with mouthwash if you wish. Do not swallow any toothpaste or mouthwash.  Use CHG Soap as directed on instruction sheet.  Bring your C-Pap machine to the hospital  Do not wear jewelry, make-up, hairpins, clips or nail polish.  For welded (permanent) jewelry: bracelets, anklets, waist bands, etc.  Please have this removed prior to surgery.  If it is not removed, there is a chance that hospital personnel will need to cut it off on the day of surgery.  Do not wear lotions, powders, or perfumes.   Do not shave body hair from the neck down 48 hours before surgery.  Contact lenses, hearing aids and dentures may not be worn into surgery.  Do not bring valuables to the hospital. [facility] is not responsible for any missing/lost belongings or valuables.   Notify your doctor if there is any change in your medical  condition (cold, fever, infection).  Wear comfortable clothing (specific to your surgery type) to the hospital.  After surgery, you can help prevent lung complications by doing breathing exercises.  Take deep breaths and cough every 1-2 hours. Your doctor may order a device called an Incentive Spirometer to help you take deep breaths. When coughing or sneezing, hold a pillow firmly  against your incision with both hands. This is called "splinting." Doing this helps protect your incision. It also decreases belly discomfort.  If you are being admitted to the hospital overnight, leave your suitcase in the car. After surgery it may be brought to your room.  In case of increased patient census, it may be necessary for you, the patient, to continue your postoperative care in the Same Day Surgery department.  If you are being discharged the day of surgery, you will not be allowed to drive home. You will need a responsible individual to drive you home and stay with you for 24 hours after surgery.   If you are taking public transportation, you will need to have a responsible individual with you.  Please call the Pre-admissions Testing Dept. at [phone] if you have any questions about these instructions.  Surgery Visitation Policy:  Patients having surgery or a procedure may have two visitors.  Children under the age of 31 must have an adult with them who is not the patient.  Inpatient Visitation:    Visiting hours are 7 a.m. to 8 p.m. Up to four visitors are allowed at one time in a patient room. The visitors may rotate out with other people during the day.  One visitor age 26 or older may stay with the patient overnight and must be in the room by 8 p.m.    Pre-operative 5 CHG Bath Instructions   You can play a key role in reducing the risk of infection after surgery. Your skin needs to be as free of germs as possible. You can reduce the number of germs on your skin by washing with CHG (chlorhexidine  gluconate) soap before surgery. CHG is an antiseptic soap that kills germs and continues to kill germs even after washing.   DO NOT use if you have an allergy to chlorhexidine /CHG or antibacterial soaps. If your skin becomes reddened or irritated, stop using the CHG and notify one of our RNs at [phone].   Please shower with the CHG soap starting 4 days before  surgery using the following schedule:     Please keep in mind the following:  DO NOT shave, including legs and underarms, starting the day of your first shower.   You may shave your face at any point before/day of surgery.  Place clean sheets on your bed the day you start using CHG soap. Use a clean washcloth (not used since being washed) for each shower. DO NOT sleep with pets once you start using the CHG.   CHG Shower Instructions:  If you choose to wash your hair and private area, wash first with your normal shampoo/soap.  After you use shampoo/soap, rinse your hair and body thoroughly to remove shampoo/soap residue.  Turn the water OFF and apply about 3 tablespoons (45 ml) of CHG soap to a CLEAN washcloth.  Apply CHG soap ONLY FROM YOUR NECK DOWN TO YOUR TOES (washing for 3-5 minutes)  DO NOT use CHG soap on face, private areas, open wounds, or sores.  Pay special attention to the area where your surgery is being performed.  If you  are having back surgery, having someone wash your back for you may be helpful. Wait 2 minutes after CHG soap is applied, then you may rinse off the CHG soap.  Pat dry with a clean towel  Put on clean clothes/pajamas   If you choose to wear lotion, please use ONLY the CHG-compatible lotions on the back of this paper.     Additional instructions for the day of surgery: DO NOT APPLY any lotions, deodorants, cologne, or perfumes.   Put on clean/comfortable clothes.  Brush your teeth.  Ask your nurse before applying any prescription medications to the skin.      CHG Compatible Lotions   Aveeno Moisturizing lotion  Cetaphil Moisturizing Cream  Cetaphil Moisturizing Lotion  Clairol Herbal Essence Moisturizing Lotion, Dry Skin  Clairol Herbal Essence Moisturizing Lotion, Extra Dry Skin  Clairol Herbal Essence Moisturizing Lotion, Normal Skin  Curel Age Defying Therapeutic Moisturizing Lotion with Alpha Hydroxy  Curel Extreme Care Body Lotion  Curel  Soothing Hands Moisturizing Hand Lotion  Curel Therapeutic Moisturizing Cream, Fragrance-Free  Curel Therapeutic Moisturizing Lotion, Fragrance-Free  Curel Therapeutic Moisturizing Lotion, Original Formula  Eucerin Daily Replenishing Lotion  Eucerin Dry Skin Therapy Plus Alpha Hydroxy Crme  Eucerin Dry Skin Therapy Plus Alpha Hydroxy Lotion  Eucerin Original Crme  Eucerin Original Lotion  Eucerin Plus Crme Eucerin Plus Lotion  Eucerin TriLipid Replenishing Lotion  Keri Anti-Bacterial Hand Lotion  Keri Deep Conditioning Original Lotion Dry Skin Formula Softly Scented  Keri Deep Conditioning Original Lotion, Fragrance Free Sensitive Skin Formula  Keri Lotion Fast Absorbing Fragrance Free Sensitive Skin Formula  Keri Lotion Fast Absorbing Softly Scented Dry Skin Formula  Keri Original Lotion  Keri Skin Renewal Lotion Keri Silky Smooth Lotion  Keri Silky Smooth Sensitive Skin Lotion  Nivea Body Creamy Conditioning Oil  Nivea Body Extra Enriched Lotion  Nivea Body Original Lotion  Nivea Body Sheer Moisturizing Lotion Nivea Crme  Nivea Skin Firming Lotion  NutraDerm 30 Skin Lotion  NutraDerm Skin Lotion  NutraDerm Therapeutic Skin Cream  NutraDerm Therapeutic Skin Lotion  ProShield Protective Hand Cream  Provon moisturizing lotion  How to Use an Incentive Spirometer An incentive spirometer is a tool that measures how well you are filling your lungs with each breath. Learning to take long, deep breaths using this tool can help you keep your lungs clear and active. This may help to reverse or lessen your chance of developing breathing (pulmonary) problems, especially infection. You may be asked to use a spirometer: After a surgery. If you have a lung problem or a history of smoking. After a long period of time when you have been unable to move or be active. If the spirometer includes an indicator to show the highest number that you have reached, your health care provider or  respiratory therapist will help you set a goal. Keep a log of your progress as told by your health care provider. What are the risks? Breathing too quickly may cause dizziness or cause you to pass out. Take your time so you do not get dizzy or light-headed. If you are in pain, you may need to take pain medicine before doing incentive spirometry. It is harder to take a deep breath if you are having pain. How to use your incentive spirometer  Sit up on the edge of your bed or on a chair. Hold the incentive spirometer so that it is in an upright position. Before you use the spirometer, breathe out normally. Place the mouthpiece in your  mouth. Make sure your lips are closed tightly around it. Breathe in slowly and as deeply as you can through your mouth, causing the piston or the ball to rise toward the top of the chamber. Hold your breath for 3-5 seconds, or for as long as possible. If the spirometer includes a coach indicator, use this to guide you in breathing. Slow down your breathing if the indicator goes above the marked areas. Remove the mouthpiece from your mouth and breathe out normally. The piston or ball will return to the bottom of the chamber. Rest for a few seconds, then repeat the steps 10 or more times. Take your time and take a few normal breaths between deep breaths so that you do not get dizzy or light-headed. Do this every 1-2 hours when you are awake. If the spirometer includes a goal marker to show the highest number you have reached (best effort), use this as a goal to work toward during each repetition. After each set of 10 deep breaths, cough a few times. This will help to make sure that your lungs are clear. If you have an incision on your chest or abdomen from surgery, place a pillow or a rolled-up towel firmly against the incision when you cough. This can help to reduce pain while taking deep breaths and coughing. General tips When you are able to get out of bed: Walk  around often. Continue to take deep breaths and cough in order to clear your lungs. Keep using the incentive spirometer until your health care provider says it is okay to stop using it. If you have been in the hospital, you may be told to keep using the spirometer at home. Contact a health care provider if: You are having difficulty using the spirometer. You have trouble using the spirometer as often as instructed. Your pain medicine is not giving enough relief for you to use the spirometer as told. You have a fever. Get help right away if: You develop shortness of breath. You develop a cough with bloody mucus from the lungs. You have fluid or blood coming from an incision site after you cough. Summary An incentive spirometer is a tool that can help you learn to take long, deep breaths to keep your lungs clear and active. You may be asked to use a spirometer after a surgery, if you have a lung problem or a history of smoking, or if you have been inactive for a long period of time. Use your incentive spirometer as instructed every 1-2 hours while you are awake. If you have an incision on your chest or abdomen, place a pillow or a rolled-up towel firmly against your incision when you cough. This will help to reduce pain. Get help right away if you have shortness of breath, you cough up bloody mucus, or blood comes from your incision when you cough. This information is not intended to replace advice given to you by your health care provider. Make sure you discuss any questions you have with your health care provider. Document Revised: 12/22/2022 Document Reviewed: 12/22/2022 Elsevier Patient Education  2024 [employer]    Preoperative Educational Videos for Total Hip, Knee and Shoulder Replacements  To better prepare for surgery, please view our videos that explain the physical activity and discharge planning required to have the best surgical recovery at [facility].  [email or web address]  Questions? Call [phone] or email [email or web address]

## 2023-07-16 NOTE — H&P (Signed)
 History of Present Illness:  Cindy Singleton is a 73 y.o. female who presents for a history and physical for an upcoming left total knee replacement to be done on Jul 17, 2023 by Dr. Daun Singleton. The patient saw Dr. Daun Singleton in February for evaluation and treatment of her left knee pain secondary to advanced degenerative joint disease. The patient is well-known to me as she is now 1.5 years status post a right total knee arthroplasty from which she has done well. She has a long history of left knee pain secondary to advanced degenerative joint disease, but notes that her symptoms have worsened over the past few months, prompting her to make this appointment. She rates her pain on today's visit at 3/10, but notes that her symptoms are worse with any prolonged standing or ambulation. She ambulates with a cane and is unable to reciprocate stairs. She also has difficulty sleeping at night. She denies any recent reinjury to the knee, and denies any numbness or paresthesias down her leg to her foot.  Current Outpatient Medications:  aspirin 81 MG EC tablet Take 81 mg by mouth once daily  atorvastatin  (LIPITOR) 20 MG tablet TAKE 1 TABLET BY MOUTH EVERY DAY 100 tablet 0  blood glucose diagnostic (ONETOUCH VERIO TEST STRIPS) test strip once daily 50 strip 10  carvediloL  (COREG ) 12.5 MG tablet Take 1 tablet (12.5 mg total) by mouth 2 (two) times daily with meals 180 tablet 3  cetirizine (ZYRTEC) 10 MG tablet Take by mouth  FLUoxetine  (PROZAC ) 40 MG capsule Take 1 capsule (40 mg total) by mouth once daily 90 capsule 3  fluticasone  (FLONASE ) 50 mcg/actuation nasal spray Place 1 spray into both nostrils once daily 0  losartan  (COZAAR ) 50 MG tablet Take 1 tablet (50 mg total) by mouth once daily 100 tablet 2  metFORMIN  (GLUCOPHAGE ) 500 MG tablet Take 2 tablets (1,000 mg total) by mouth 2 (two) times daily with meals 360 tablet 3  omeprazole (PRILOSEC) 40 MG DR capsule TAKE 1 CAPSULE BY MOUTH EVERY DAY 100 capsule 3   tirzepatide (MOUNJARO) 5 mg/0.5 mL pen injector Inject 0.5 mLs (5 mg total) subcutaneously once a week for 90 days 6 mL 0  lancets Use 1 each once daily Dispense Verio lancets. Diagnosis code E11.9 50 each 10   Allergies:  Shellfish Containing Products - Itching and Swelling  Sulfa (Sulfonamide Antibiotics) - Swelling, Hives, Itching and Rash  Lisinopril - Headache and Severe headaches  Propoxyphene Hives, Swelling and Other (See Comments)   Past Medical History:  AHI 34/hr. Hypoxemia noted throughout study 09/09/2020  Allergic state  Arthritis 07/16/2012  Ascending aorta dilatation () 07/29/2018  3.5cm noted on ECHO in May 2020  Ascending aorta dilatation ()  Barrett's esophagus  Chronic pain of right knee  Current severe episode of major depressive disorder without psychotic features without prior episode (CMS/HHS-HCC) 05/29/2017  Depression  Diabetes mellitus type 2, uncomplicated (CMS/HHS-HCC)  Dyspnea  Edema 07/16/2012  GERD (gastroesophageal reflux disease)  Heart murmur  Hyperlipidemia, mixed 08/03/2020  Hypertension  LVH (left ventricular hypertrophy) due to hypertensive disease, without heart failure  Migraines  Obesity  PONV (postoperative nausea and vomiting)  Primary osteoarthritis of right knee 05/20/2014  Stomach ulcer   Past Surgical History:  HERNIA REPAIR 2010  Arthroscopic partial medial meniscectomy and extensive arthroscopic debridement right knee Right 07/07/2014 (Dr. Daun Singleton)  COLONOSCOPY W/BIOPSY N/A 03/27/2017  Procedure: SCREENING; COLONOSCOPY; Surgeon: Cindy Purchase, MD; Location: [facility]; Service: Gastroenterology; Laterality: N/A;  ESOPHAGOGASTRODOUDENOSCOPY W/BIOPSY  N/A 04/10/2017  Procedure: EGD; Surgeon: Singleton, Cindy Lyn, MD; Location: [facility]; Service: Gastroenterology; Laterality: N/A;  Right TKA using all-cemented Bi[employer] Vanguard system with a 65 mm PCR femur, a 67 mm tibial tray with a 16 mm anterior  stabilized E-poly insert, and a 31 x 6.2 mm all-poly 3-pegged domed patella Right 12/13/2021 (Dr. Daun Singleton)  EXTRACTION CATARACT EXTRACAPSULAR W/INSERTION INTRAOCULAR PROSTHESIS Right 01/10/2023  Procedure: RIGHT R: EXTRACTION CATARACT EXTRACAPSULAR WITH PHACO WITH INSERTION INTRAOCULAR PROSTHESIS; Surgeon: Cindy Hugh, MD; Location: [facility]; Service: Ophthalmology; Laterality: Right;  EXTRACTION CATARACT EXTRACAPSULAR W/INSERTION INTRAOCULAR PROSTHESIS Left 01/31/2023  Procedure: L: EXTRACTION CATARACT EXTRACAPSULAR WITH PHACO WITH INSERTION INTRAOCULAR PROSTHESIS; Surgeon: Cindy Hugh, MD; Location: [facility]; Service: Ophthalmology; Laterality: Left;  APPENDECTOMY  Back surgery  CHOLECYSTECTOMY  HYSTERECTOMY  KNEE ARTHROSCOPY Left  LAPAROSCOPIC TUBAL LIGATION  Sinus surgery   Family History:  Lung cancer Mother  Stomach cancer Father  Breast cancer Sister  Diabetes type I Daughter  Skin cancer Son  Skin cancer Son Cindy Singleton  Stage 4 melanoma  Anesthesia problems Neg Hx   Social History:   Socioeconomic History:  Marital status: Single  Tobacco Use  Smoking status: Former  Current packs/day: 0.00  Average packs/day: 1 Singleton/day for 30.0 years (30.0 ttl pk-yrs)  Types: Cigarettes  Start date: 06/29/1974  Quit date: 06/28/2004  Years since quitting: 19.0  Smokeless tobacco: Never  Vaping Use  Vaping status: Never Used  Substance and Sexual Activity  Alcohol use: Not Currently  Alcohol/week: 0.0 standard drinks of alcohol  Drug use: No  Sexual activity: Not Currently  Social History Narrative  Divorced, 1 son (advanced malignant melanoma), 2 daughters  BS degr; Retired Science writer  Former smoker 1ppd x30 years; quit ~2005  Denies tobacco/ETOH/drug use  No exercise   03/28/2023  Military Service: None  Likes/Enjoys/What fills your day?: Narrative  Home: Two Story  Your Bedrooms is on: Second Level 13 stairs with railings on one side   Fewest Steps to enter the home: 0  Other persons in the home: none  Pets: none  Medical equipment you use daily: [employer] equipment available in the home: Cane  Dental: no significant dental problems, upper dentures. Last screening Date: December 2019 Screening is: Declined  Vision: Advertising account executive. Last screening Date: January 2025 Screening is: Up to date. [facility]  Hearing: Denies any issues in Hearing . No screening  Dermatology: Denies any areas of concern on skin. No screening   Social Drivers of Health:   Physicist, medical Strain: Low Risk (03/27/2023)  Overall Financial Resource Strain (CARDIA)  Difficulty of Paying Living Expenses: Not very hard  Food Insecurity: No Food Insecurity (03/27/2023)  Hunger Vital Sign  Worried About Running Out of Food in the Last Year: Never true  Ran Out of Food in the Last Year: Never true  Transportation Needs: No Transportation Needs (03/27/2023)  PRAPARE - Risk analyst (Medical): No  Lack of Transportation (Non-Medical): No   Review of Systems:  A comprehensive 14 point ROS was performed, reviewed, and the pertinent orthopaedic findings are documented in the HPI.  Physical Exam: Vitals:  07/04/23 1114  BP: 128/78  Weight: (!) 113.4 kg (250 lb)  Height: 152.4 cm (5')  PainSc: 4  PainLoc: Knee   General/Constitutional: Pleasant significantly overweight elderly female in no acute distress. Neuro/Psych: Normal mood and affect, oriented to person, place and time. Eyes: Non-icteric. Pupils are equal, round, and reactive to light,  and exhibit synchronous movement. ENT: Unremarkable. Lymphatic: No palpable adenopathy. Respiratory: Lungs clear to auscultation, Normal chest excursion, No wheezes, and Non-labored breathing Cardiovascular: Regular rate and rhythm. No murmurs. and No edema, swelling or tenderness, except as noted in detailed exam. Integumentary: No impressive skin lesions present,  except as noted in detailed exam. Musculoskeletal: Unremarkable, except as noted in detailed exam.  Heart: Examination of the heart reveals regular, rate, and rhythm. There is no murmur noted on ascultation. There is a normal apical pulse.  Lungs: Lungs are clear to auscultation. There is no wheeze, rhonchi, or crackles. There is normal expansion of bilateral chest walls.   Left knee exam: The patient ambulates with a mildly antalgic gait, favoring her left leg, and uses a cane in her right hand for balance and support. Skin inspection of the left knee is notable for mild swelling, but otherwise is unremarkable. No erythema, ecchymosis, abrasions, or other skin abnormalities are identified. She exhibits moderate tenderness to palpation along the medial aspect of her knee and mild tenderness to palpation along the lateral joint line. She has no peripatellar tenderness. Actively, she is able to extend her knee fully and flex her knee beyond 120 degrees with mild discomfort in maximal flexion. Her patella tracks well and is without crepitance. The knee is stable to varus and valgus stressing, but does exhibit some pseudolaxity to varus stressing. She is neurovascularly intact left lower extremity and foot.  X-rays/MRI/Lab data:  Standing AP and lateral x-rays of the left knee, as well as a sunrise view, are obtained. These films demonstrate significant degenerative changes, primarily involving the medial compartment with near complete loss of the medial compartment clear space. Lesser degenerative changes of the lateral more so than patellofemoral compartments also are noted. No lytic lesions or fractures are identified.  Assessment: 1. Primary osteoarthritis of left knee.  2. Morbid obesity with BMI of 45.0-49.9, adult (CMS/HHS-HCC).  3. Type 2 diabetes mellitus with stage 3a chronic kidney disease, without long-term current use of insulin  (CMS/HHS-HCC).   Plan: The treatment options were  discussed with the patient. In addition, patient educational materials were provided regarding the diagnosis and treatment options. The patient is quite frustrated by her symptoms and functional limitations, and is ready to consider more aggressive treatment options. Therefore, I have recommended a surgical procedure, specifically a left total knee arthroplasty. The procedure was discussed with the patient, as were the potential risks (including bleeding, infection, nerve and/or blood vessel injury, persistent or recurrent pain, loosening and/or failure of the components, dislocation, need for further surgery, blood clots, strokes, heart attacks and/or arhythmias, pneumonia, etc.) and benefits. The patient states his/her understanding and wishes to proceed. All of the patient's questions and concerns were answered. She can call any time with further concerns. She will follow up post-surgery, routine.    H&P reviewed and patient re-examined. No changes.

## 2023-07-17 ENCOUNTER — Ambulatory Visit: Payer: Self-pay | Admitting: Urgent Care

## 2023-07-17 ENCOUNTER — Other Ambulatory Visit: Payer: Self-pay

## 2023-07-17 ENCOUNTER — Ambulatory Visit
Admission: RE | Admit: 2023-07-17 | Discharge: 2023-07-18 | Disposition: A | Discharge status: Home / Self Care | Source: Ambulatory Visit | Attending: Surgery | Admitting: Surgery

## 2023-07-17 ENCOUNTER — Encounter
Admission: RE | Disposition: A | Discharge status: Home / Self Care | Payer: Self-pay | Source: Ambulatory Visit | Attending: Surgery

## 2023-07-17 ENCOUNTER — Ambulatory Visit

## 2023-07-17 ENCOUNTER — Encounter: Payer: Self-pay | Admitting: Surgery

## 2023-07-17 DIAGNOSIS — E66813 Obesity, class 3: Secondary | ICD-10-CM | POA: Diagnosis not present

## 2023-07-17 DIAGNOSIS — E1122 Type 2 diabetes mellitus with diabetic chronic kidney disease: Secondary | ICD-10-CM | POA: Diagnosis not present

## 2023-07-17 DIAGNOSIS — G4733 Obstructive sleep apnea (adult) (pediatric): Secondary | ICD-10-CM | POA: Insufficient documentation

## 2023-07-17 DIAGNOSIS — F32A Depression, unspecified: Secondary | ICD-10-CM | POA: Insufficient documentation

## 2023-07-17 DIAGNOSIS — K219 Gastro-esophageal reflux disease without esophagitis: Secondary | ICD-10-CM | POA: Insufficient documentation

## 2023-07-17 DIAGNOSIS — Z8711 Personal history of peptic ulcer disease: Secondary | ICD-10-CM | POA: Insufficient documentation

## 2023-07-17 DIAGNOSIS — Z87891 Personal history of nicotine dependence: Secondary | ICD-10-CM | POA: Diagnosis not present

## 2023-07-17 DIAGNOSIS — M1712 Unilateral primary osteoarthritis, left knee: Secondary | ICD-10-CM | POA: Diagnosis present

## 2023-07-17 DIAGNOSIS — Z6841 Body Mass Index (BMI) 40.0 and over, adult: Secondary | ICD-10-CM | POA: Insufficient documentation

## 2023-07-17 DIAGNOSIS — Z79899 Other long term (current) drug therapy: Secondary | ICD-10-CM | POA: Insufficient documentation

## 2023-07-17 DIAGNOSIS — N1831 Chronic kidney disease, stage 3a: Secondary | ICD-10-CM | POA: Diagnosis not present

## 2023-07-17 DIAGNOSIS — M1990 Unspecified osteoarthritis, unspecified site: Secondary | ICD-10-CM | POA: Diagnosis not present

## 2023-07-17 DIAGNOSIS — Z833 Family history of diabetes mellitus: Secondary | ICD-10-CM | POA: Insufficient documentation

## 2023-07-17 DIAGNOSIS — Z7984 Long term (current) use of oral hypoglycemic drugs: Secondary | ICD-10-CM | POA: Insufficient documentation

## 2023-07-17 DIAGNOSIS — Z96652 Presence of left artificial knee joint: Secondary | ICD-10-CM

## 2023-07-17 DIAGNOSIS — Z96651 Presence of right artificial knee joint: Secondary | ICD-10-CM | POA: Insufficient documentation

## 2023-07-17 DIAGNOSIS — I129 Hypertensive chronic kidney disease with stage 1 through stage 4 chronic kidney disease, or unspecified chronic kidney disease: Secondary | ICD-10-CM | POA: Diagnosis not present

## 2023-07-17 DIAGNOSIS — Z01818 Encounter for other preprocedural examination: Secondary | ICD-10-CM

## 2023-07-17 DIAGNOSIS — Z7985 Long-term (current) use of injectable non-insulin antidiabetic drugs: Secondary | ICD-10-CM | POA: Insufficient documentation

## 2023-07-17 HISTORY — PX: TOTAL KNEE ARTHROPLASTY: SHX125

## 2023-07-17 LAB — GLUCOSE, CAPILLARY
Glucose-Capillary: 122 mg/dL — ABNORMAL HIGH (ref 70–99)
Glucose-Capillary: 142 mg/dL — ABNORMAL HIGH (ref 70–99)
Glucose-Capillary: 173 mg/dL — ABNORMAL HIGH (ref 70–99)
Glucose-Capillary: 287 mg/dL — ABNORMAL HIGH (ref 70–99)

## 2023-07-17 SURGERY — ARTHROPLASTY, KNEE, TOTAL
Anesthesia: Spinal | Site: Knee | Laterality: Left

## 2023-07-17 MED ORDER — ORAL CARE MOUTH RINSE: 15.0000 mL | Freq: Once | OROMUCOSAL | Status: AC

## 2023-07-17 MED ORDER — CEFAZOLIN SODIUM-DEXTROSE 2-4 GM/100ML-% IV SOLN
INTRAVENOUS | Status: AC
  Filled 2023-07-17: qty 100

## 2023-07-17 MED ORDER — INSULIN ASPART 100 UNIT/ML IJ SOLN
0.0000 [IU] | Freq: Every day | INTRAMUSCULAR | Status: DC
  Administered 2023-07-17: 3 [IU] via SUBCUTANEOUS

## 2023-07-17 MED ORDER — CEFAZOLIN SODIUM-DEXTROSE 3-4 GM/150ML-% IV SOLN
3.0000 g | Freq: Four times a day (QID) | INTRAVENOUS | Status: AC
  Administered 2023-07-17 (×2): 3 g via INTRAVENOUS
  Filled 2023-07-17 (×3): qty 150

## 2023-07-17 MED ORDER — OXYCODONE HCL 5 MG PO TABS: 5.0000 mg | ORAL_TABLET | Freq: Once | ORAL | Status: DC | PRN

## 2023-07-17 MED ORDER — CEFAZOLIN SODIUM 1 G IJ SOLR
INTRAMUSCULAR | Status: AC
  Filled 2023-07-17: qty 10

## 2023-07-17 MED ORDER — PROPOFOL 1000 MG/100ML IV EMUL
INTRAVENOUS | Status: AC
  Filled 2023-07-17: qty 100

## 2023-07-17 MED ORDER — PROPOFOL 10 MG/ML IV BOLUS
INTRAVENOUS | Status: AC
  Filled 2023-07-17: qty 20

## 2023-07-17 MED ORDER — SODIUM CHLORIDE 0.9 % IR SOLN
Status: DC | PRN
Start: 2023-07-17 — End: 2023-07-17
  Administered 2023-07-17: 3000 mL

## 2023-07-17 MED ORDER — ACETAMINOPHEN 10 MG/ML IV SOLN
INTRAVENOUS | Status: DC | PRN
  Administered 2023-07-17: 1000 mg via INTRAVENOUS

## 2023-07-17 MED ORDER — KETOROLAC TROMETHAMINE 15 MG/ML IJ SOLN
INTRAMUSCULAR | Status: AC
  Filled 2023-07-17: qty 1

## 2023-07-17 MED ORDER — METOCLOPRAMIDE HCL 10 MG PO TABS: 5.0000 mg | ORAL_TABLET | Freq: Three times a day (TID) | ORAL | Status: DC | PRN

## 2023-07-17 MED ORDER — CARVEDILOL 3.125 MG PO TABS
12.5000 mg | ORAL_TABLET | Freq: Two times a day (BID) | ORAL | Status: DC
  Administered 2023-07-17 – 2023-07-18 (×2): 12.5 mg via ORAL
  Filled 2023-07-17 (×2): qty 4

## 2023-07-17 MED ORDER — OXYCODONE HCL 5 MG/5ML PO SOLN: 5.0000 mg | Freq: Once | ORAL | Status: DC | PRN

## 2023-07-17 MED ORDER — TRANEXAMIC ACID-NACL 1000-0.7 MG/100ML-% IV SOLN
1000.0000 mg | INTRAVENOUS | Status: AC
  Administered 2023-07-17: 1000 mg via INTRAVENOUS

## 2023-07-17 MED ORDER — KETOROLAC TROMETHAMINE 15 MG/ML IJ SOLN
7.5000 mg | Freq: Four times a day (QID) | INTRAMUSCULAR | Status: DC
  Administered 2023-07-17 – 2023-07-18 (×3): 7.5 mg via INTRAVENOUS
  Filled 2023-07-17 (×3): qty 1

## 2023-07-17 MED ORDER — METFORMIN HCL 500 MG PO TABS
1000.0000 mg | ORAL_TABLET | Freq: Two times a day (BID) | ORAL | Status: DC
  Administered 2023-07-18: 1000 mg via ORAL

## 2023-07-17 MED ORDER — BISACODYL 10 MG RE SUPP: 10.0000 mg | Freq: Every day | RECTAL | Status: DC | PRN

## 2023-07-17 MED ORDER — DIPHENHYDRAMINE HCL 12.5 MG/5ML PO ELIX: 12.5000 mg | ORAL_SOLUTION | ORAL | Status: DC | PRN

## 2023-07-17 MED ORDER — TRIAMCINOLONE ACETONIDE 40 MG/ML IJ SUSP
INTRAMUSCULAR | Status: DC | PRN
  Administered 2023-07-17: 93 mL via INTRAMUSCULAR

## 2023-07-17 MED ORDER — DEXAMETHASONE SODIUM PHOSPHATE 10 MG/ML IJ SOLN
INTRAMUSCULAR | Status: AC
  Filled 2023-07-17: qty 1

## 2023-07-17 MED ORDER — MAGNESIUM HYDROXIDE 400 MG/5ML PO SUSP
30.0000 mL | Freq: Every day | ORAL | Status: DC | PRN
  Administered 2023-07-18: 30 mL via ORAL
  Filled 2023-07-17: qty 30

## 2023-07-17 MED ORDER — SODIUM CHLORIDE (PF) 0.9 % IJ SOLN
INTRAMUSCULAR | Status: AC
  Filled 2023-07-17: qty 40

## 2023-07-17 MED ORDER — DEXAMETHASONE SODIUM PHOSPHATE 10 MG/ML IJ SOLN
INTRAMUSCULAR | Status: DC | PRN
  Administered 2023-07-17: 10 mg via INTRAVENOUS

## 2023-07-17 MED ORDER — BUPIVACAINE LIPOSOME 1.3 % IJ SUSP
INTRAMUSCULAR | Status: AC
  Filled 2023-07-17: qty 20

## 2023-07-17 MED ORDER — MIDAZOLAM HCL 2 MG/2ML IJ SOLN
INTRAMUSCULAR | Status: AC
  Filled 2023-07-17: qty 2

## 2023-07-17 MED ORDER — CEFAZOLIN SODIUM-DEXTROSE 2-4 GM/100ML-% IV SOLN
2.0000 g | INTRAVENOUS | Status: AC
  Administered 2023-07-17: 3 g via INTRAVENOUS

## 2023-07-17 MED ORDER — ONDANSETRON HCL 4 MG/2ML IJ SOLN
INTRAMUSCULAR | Status: AC
  Filled 2023-07-17: qty 2

## 2023-07-17 MED ORDER — CEFAZOLIN SODIUM-DEXTROSE 2-4 GM/100ML-% IV SOLN: 2.0000 g | Freq: Four times a day (QID) | INTRAVENOUS | Status: DC

## 2023-07-17 MED ORDER — ACETAMINOPHEN 10 MG/ML IV SOLN
INTRAVENOUS | Status: AC
  Filled 2023-07-17: qty 100

## 2023-07-17 MED ORDER — ONDANSETRON HCL 4 MG/2ML IJ SOLN: 4.0000 mg | Freq: Once | INTRAMUSCULAR | Status: DC | PRN

## 2023-07-17 MED ORDER — CHLORHEXIDINE GLUCONATE 0.12 % MT SOLN
15.0000 mL | Freq: Once | OROMUCOSAL | Status: AC
  Administered 2023-07-17: 15 mL via OROMUCOSAL

## 2023-07-17 MED ORDER — MIDAZOLAM HCL 5 MG/5ML IJ SOLN
INTRAMUSCULAR | Status: DC | PRN
  Administered 2023-07-17 (×2): 1 mg via INTRAVENOUS

## 2023-07-17 MED ORDER — METOCLOPRAMIDE HCL 5 MG/ML IJ SOLN: 5.0000 mg | Freq: Three times a day (TID) | INTRAMUSCULAR | Status: DC | PRN

## 2023-07-17 MED ORDER — DOCUSATE SODIUM 100 MG PO CAPS
100.0000 mg | ORAL_CAPSULE | Freq: Two times a day (BID) | ORAL | Status: DC
  Administered 2023-07-17 – 2023-07-18 (×2): 100 mg via ORAL
  Filled 2023-07-17: qty 1

## 2023-07-17 MED ORDER — OXYCODONE HCL 5 MG PO TABS
5.0000 mg | ORAL_TABLET | ORAL | Status: DC | PRN
  Administered 2023-07-17: 5 mg via ORAL
  Filled 2023-07-17: qty 1

## 2023-07-17 MED ORDER — BUPIVACAINE-EPINEPHRINE (PF) 0.5% -1:200000 IJ SOLN
INTRAMUSCULAR | Status: AC
  Filled 2023-07-17: qty 30

## 2023-07-17 MED ORDER — TRIAMCINOLONE ACETONIDE 40 MG/ML IJ SUSP
INTRAMUSCULAR | Status: AC
  Filled 2023-07-17: qty 2

## 2023-07-17 MED ORDER — ONDANSETRON HCL 4 MG/2ML IJ SOLN
INTRAMUSCULAR | Status: DC | PRN
  Administered 2023-07-17: 4 mg via INTRAVENOUS

## 2023-07-17 MED ORDER — TRANEXAMIC ACID-NACL 1000-0.7 MG/100ML-% IV SOLN
INTRAVENOUS | Status: AC
  Filled 2023-07-17: qty 100

## 2023-07-17 MED ORDER — ACETAMINOPHEN 500 MG PO TABS
1000.0000 mg | ORAL_TABLET | Freq: Four times a day (QID) | ORAL | Status: DC
  Administered 2023-07-17 – 2023-07-18 (×3): 1000 mg via ORAL
  Filled 2023-07-17 (×3): qty 2

## 2023-07-17 MED ORDER — FENTANYL CITRATE (PF) 100 MCG/2ML IJ SOLN: 25.0000 ug | INTRAMUSCULAR | Status: DC | PRN

## 2023-07-17 MED ORDER — CHLORHEXIDINE GLUCONATE 0.12 % MT SOLN
OROMUCOSAL | Status: AC
  Filled 2023-07-17: qty 15

## 2023-07-17 MED ORDER — ONDANSETRON HCL 4 MG/2ML IJ SOLN
4.0000 mg | Freq: Four times a day (QID) | INTRAMUSCULAR | Status: DC | PRN
  Administered 2023-07-17: 4 mg via INTRAVENOUS
  Filled 2023-07-17: qty 2

## 2023-07-17 MED ORDER — STERILE WATER FOR IRRIGATION IR SOLN
Status: DC | PRN
  Administered 2023-07-17: 1000 mL

## 2023-07-17 MED ORDER — KETOROLAC TROMETHAMINE 30 MG/ML IJ SOLN
INTRAMUSCULAR | Status: AC
  Filled 2023-07-17: qty 1

## 2023-07-17 MED ORDER — APIXABAN 2.5 MG PO TABS
2.5000 mg | ORAL_TABLET | Freq: Two times a day (BID) | ORAL | Status: DC
  Administered 2023-07-18: 2.5 mg via ORAL

## 2023-07-17 MED ORDER — KETOROLAC TROMETHAMINE 15 MG/ML IJ SOLN
15.0000 mg | Freq: Once | INTRAMUSCULAR | Status: AC
  Administered 2023-07-17: 15 mg via INTRAVENOUS

## 2023-07-17 MED ORDER — INSULIN ASPART 100 UNIT/ML IJ SOLN
0.0000 [IU] | Freq: Three times a day (TID) | INTRAMUSCULAR | Status: DC
  Administered 2023-07-17: 3 [IU] via SUBCUTANEOUS
  Administered 2023-07-18: 7 [IU] via SUBCUTANEOUS
  Filled 2023-07-17 (×3): qty 1

## 2023-07-17 MED ORDER — PROPOFOL 10 MG/ML IV BOLUS
INTRAVENOUS | Status: DC | PRN
  Administered 2023-07-17: 20 mg via INTRAVENOUS
  Administered 2023-07-17: 50 ug/kg/min via INTRAVENOUS
  Administered 2023-07-17: 40 mg via INTRAVENOUS

## 2023-07-17 MED ORDER — SODIUM CHLORIDE 0.9 % IV SOLN: INTRAVENOUS | Status: DC

## 2023-07-17 MED ORDER — PHENYLEPHRINE HCL-NACL 20-0.9 MG/250ML-% IV SOLN
INTRAVENOUS | Status: DC | PRN
  Administered 2023-07-17: 50 ug/min via INTRAVENOUS

## 2023-07-17 MED ORDER — FLEET ENEMA RE ENEM: 1.0000 | ENEMA | Freq: Once | RECTAL | Status: DC | PRN

## 2023-07-17 MED ORDER — ONDANSETRON HCL 4 MG PO TABS: 4.0000 mg | ORAL_TABLET | Freq: Four times a day (QID) | ORAL | Status: DC | PRN

## 2023-07-17 MED ORDER — ACETAMINOPHEN 325 MG PO TABS: 325.0000 mg | ORAL_TABLET | Freq: Four times a day (QID) | ORAL | Status: DC | PRN

## 2023-07-17 MED ORDER — ACETAMINOPHEN 10 MG/ML IV SOLN: 1000.0000 mg | Freq: Once | INTRAVENOUS | Status: DC | PRN

## 2023-07-17 MED ORDER — FLUOXETINE HCL 20 MG PO CAPS
40.0000 mg | ORAL_CAPSULE | ORAL | Status: DC
  Administered 2023-07-18: 40 mg via ORAL
  Filled 2023-07-17: qty 2

## 2023-07-17 MED ORDER — DOCUSATE SODIUM 100 MG PO CAPS
ORAL_CAPSULE | ORAL | Status: AC
Start: 2023-07-17 — End: ?
  Filled 2023-07-17: qty 1

## 2023-07-17 MED ORDER — PHENYLEPHRINE HCL-NACL 20-0.9 MG/250ML-% IV SOLN
INTRAVENOUS | Status: AC
  Filled 2023-07-17: qty 250

## 2023-07-17 MED ORDER — BUPIVACAINE HCL (PF) 0.5 % IJ SOLN
INTRAMUSCULAR | Status: DC | PRN
  Administered 2023-07-17: 2.4 mL

## 2023-07-17 MED ORDER — ATORVASTATIN CALCIUM 10 MG PO TABS
20.0000 mg | ORAL_TABLET | ORAL | Status: DC
  Administered 2023-07-18: 20 mg via ORAL
  Filled 2023-07-17: qty 2

## 2023-07-17 MED ORDER — PANTOPRAZOLE SODIUM 40 MG PO TBEC
40.0000 mg | DELAYED_RELEASE_TABLET | Freq: Every day | ORAL | Status: DC
  Administered 2023-07-18: 40 mg via ORAL

## 2023-07-17 MED ORDER — LOSARTAN POTASSIUM 50 MG PO TABS
50.0000 mg | ORAL_TABLET | ORAL | Status: DC
  Administered 2023-07-18: 50 mg via ORAL
  Filled 2023-07-17: qty 1

## 2023-07-17 SURGICAL SUPPLY — 52 items
BLADE SAW 90X13X1.19 OSCILLAT (BLADE) ×1 IMPLANT
BLADE SAW SAG 25X90X1.19 (BLADE) ×1 IMPLANT
BLADE SURG SZ20 CARB STEEL (BLADE) ×1 IMPLANT
BNDG ELASTIC 6X5.8 VLCR NS LF (GAUZE/BANDAGES/DRESSINGS) ×1 IMPLANT
CEMENT BONE R 1X40 (Cement) ×2 IMPLANT
CEMENT VACUUM MIXING SYSTEM (MISCELLANEOUS) ×1 IMPLANT
CHLORAPREP W/TINT 26 (MISCELLANEOUS) ×1 IMPLANT
COMPONENT FEM KNEE STD PS 7 LT (Joint) IMPLANT
COMPONENT PAT3 PEG SERS 31/6.2 (Miscellaneous) IMPLANT
COOLER POLAR GLACIER W/PUMP (MISCELLANEOUS) ×1 IMPLANT
COVER MAYO STAND STRL (DRAPES) ×1 IMPLANT
CUFF TRNQT CYL 34X4.125X (TOURNIQUET CUFF) IMPLANT
DRAPE IMP U-DRAPE 54X76 (DRAPES) ×1 IMPLANT
DRAPE SHEET LG 3/4 BI-LAMINATE (DRAPES) ×1 IMPLANT
DRAPE U-SHAPE 47X51 STRL (DRAPES) ×1 IMPLANT
DRSG MEPILEX SACRM 8.7X9.8 (GAUZE/BANDAGES/DRESSINGS) IMPLANT
DRSG OPSITE POSTOP 4X10 (GAUZE/BANDAGES/DRESSINGS) ×1 IMPLANT
DRSG OPSITE POSTOP 4X8 (GAUZE/BANDAGES/DRESSINGS) ×1 IMPLANT
ELECT CAUTERY BLADE 6.4 (BLADE) ×1 IMPLANT
ELECTRODE REM PT RTRN 9FT ADLT (ELECTROSURGICAL) ×1 IMPLANT
GAUZE XEROFORM 1X8 LF (GAUZE/BANDAGES/DRESSINGS) ×1 IMPLANT
GLOVE BIO SURGEON STRL SZ7.5 (GLOVE) ×4 IMPLANT
GLOVE BIO SURGEON STRL SZ8 (GLOVE) ×4 IMPLANT
GLOVE BIOGEL PI IND STRL 8 (GLOVE) ×1 IMPLANT
GLOVE INDICATOR 8.0 STRL GRN (GLOVE) ×1 IMPLANT
GOWN STRL REUS W/ TWL LRG LVL3 (GOWN DISPOSABLE) ×1 IMPLANT
GOWN STRL REUS W/ TWL XL LVL3 (GOWN DISPOSABLE) ×1 IMPLANT
HOOD PEEL AWAY T7 (MISCELLANEOUS) ×3 IMPLANT
KIT TURNOVER KIT A (KITS) ×1 IMPLANT
MANIFOLD NEPTUNE II (INSTRUMENTS) ×1 IMPLANT
NDL SPNL 20GX3.5 QUINCKE YW (NEEDLE) ×1 IMPLANT
NEEDLE SPNL 20GX3.5 QUINCKE YW (NEEDLE) ×1 IMPLANT
NS IRRIG 500ML POUR BTL (IV SOLUTION) ×1 IMPLANT
PACK TOTAL KNEE (MISCELLANEOUS) ×1 IMPLANT
PAD WRAPON POLAR KNEE (MISCELLANEOUS) ×1 IMPLANT
PENCIL SMOKE EVACUATOR (MISCELLANEOUS) ×1 IMPLANT
PIN DRILL HDLS TROCAR 75 4PK (PIN) IMPLANT
SCREW FEMALE HEX FIX 25X2.5 (ORTHOPEDIC DISPOSABLE SUPPLIES) IMPLANT
SOL .9 NS 3000ML IRR UROMATIC (IV SOLUTION) ×1 IMPLANT
STAPLER SKIN PROX 35W (STAPLE) ×1 IMPLANT
STEM MED AS PERS SZ6-7 11 (Stem) IMPLANT
STEM TIB PERS SZ E 5D LT (Screw) IMPLANT
STOCKINETTE IMPERV 14X48 (MISCELLANEOUS) ×1 IMPLANT
SUCTION TUBE FRAZIER 10FR DISP (SUCTIONS) ×1 IMPLANT
SUT VIC AB 0 CT1 36 (SUTURE) ×3 IMPLANT
SUT VIC AB 2-0 CT1 TAPERPNT 27 (SUTURE) ×3 IMPLANT
SYR 10ML LL (SYRINGE) ×1 IMPLANT
SYR 20ML LL LF (SYRINGE) ×1 IMPLANT
SYR 30ML LL (SYRINGE) IMPLANT
TIP FAN IRRIG PULSAVAC PLUS (DISPOSABLE) ×1 IMPLANT
TRAP FLUID SMOKE EVACUATOR (MISCELLANEOUS) ×1 IMPLANT
WATER STERILE IRR 500ML POUR (IV SOLUTION) ×1 IMPLANT

## 2023-07-17 NOTE — Op Note (Signed)
 07/17/2023  12:36 PM  Patient:   Cindy Singleton  Pre-Op Diagnosis:   Degenerative joint disease, left knee.  Post-Op Diagnosis:   Same  Procedure:   Left TKA using all-pressfit Zimmer Persona system with a #7 PCR femur, a(n) E-sized  tibial tray with an 11 mm medial congruent E-poly insert, and a 31 x 6.2 mm all-poly cemented Vanguard 3-pegged domed patella.  Surgeon:   Lonnie Roberts, MD  Assistant:   Edie Goon, PA-C   Anesthesia:   Spinal  Findings:   As above  Complications:   None  EBL:   15 cc  Fluids:   400 cc crystalloid  UOP:   None  TT:   90 minutes at 300 mmHg  Drains:   None  Closure:   Staples  Implants:   As above  Brief Clinical Note:   The patient is a 73 year old female with a long history of progressively worsening left knee pain. The patient's symptoms have progressed despite medications, activity modification, injections, etc. The patient's history and examination were consistent with advanced degenerative joint disease of the left knee confirmed by plain radiographs. The patient presents at this time for a left total knee arthroplasty.  Procedure:   The patient was brought into the operating room. After adequate spinal anesthesia was obtained, the patient was repositioned in the supine position on the operating room table. The left lower extremity was prepped with ChloraPrep solution and draped sterilely. Preoperative antibiotics were administered. A timeout was performed to verify the appropriate surgical site before the limb was exsanguinated with an Esmarch and the tourniquet inflated to 300 mmHg.   A standard anterior approach to the knee was made through an approximately 6-7 inch incision. The incision was carried down through the subcutaneous tissues to expose superficial retinaculum. This was split the length of the incision and the medial flap elevated sufficiently to expose the medial retinaculum. The medial retinaculum was incised, leaving a  3-4 mm cuff of tissue on the patella. This was extended distally along the medial border of the patellar tendon and proximally through the medial third of the quadriceps tendon. A subtotal fat pad excision was performed before the soft tissues were elevated off the anteromedial and anterolateral aspects of the proximal tibia to the level of the collateral ligaments. The anterior portions of the medial and lateral menisci were removed, as was the anterior cruciate ligament. With the knee flexed to 90, the external tibial guide was positioned and the appropriate proximal tibial cut made. This piece was taken to the back table where it was measured and found to be optimally replicated by a(n) E-sized component.  Attention was directed to the distal femur. The intramedullary canal was accessed through a 3/8" drill hole. The intramedullary guide was inserted and placed at 5 of valgus alignment. Using the +0 slot, the distal cut was made. The distal femur was measured and found to be optimally replicated by the #7 component. The #7 4-in-1 cutting block was positioned and first the posterior, then the posterior chamfer, the anterior, and finally the anterior chamfer cuts were made after verifying that the anterior cortex would not be notched.   At this point, the posterior portions medial and lateral menisci were removed. A trial reduction was performed using the appropriate femoral and tibial components with first the 10 mm and then the 11 mm insert. The 11 mm insert demonstrated excellent stability to varus and valgus stressing both in flexion and extension while permitting  full extension. Patellar tracking was assessed and found to be excellent. The tibial trial position was marked on the proximal tibia. The patella thickness was measured and found to be 20 mm, so the appropriate cut was made. The patellar surface was measured and found to be optimally replicated by the 31 mm component. The three peg holes were  drilled in place before the trial button was inserted. Patella tracking was assessed and found to be excellent, passing the "no thumb test". The lug holes were drilled into the distal femur before the trial component was removed.  The tibial tray was repositioned before the keel was created using the appropriate tower, drills, and punch.  The bony surfaces were prepared for implantation by irrigating them thoroughly with sterile saline solution via the jet lavage system. A bone plug was fashioned from some of the bone that had been removed previously and used to plug the distal femoral canal. In addition, a "cocktail" of 20 cc of Exparel , 30 cc of 0.5% Sensorcaine , 2 cc of Kenalog  40 (80 mg), and 30 mg of Toradol  diluted out to 90 cc with normal saline was injected into the postero-medial and postero-lateral aspects of the knee, the medial and lateral gutter regions, and the peri-incisional tissues to help with postoperative analgesia.    The tibial tray was impacted into place first with care taken to be sure the component was fully seated. Next, the femoral component was impacted into place again with care taken to be sure that the component was fully improperly seated. The permanent 11 mm medial congruent E-polyethylene insert was snapped into place with care taken to ensure appropriate locking of the insert. Finally, the patellar surface was prepared for cementing by irrigating it thoroughly with sterile saline solution via the jet lavage system.   Meanwhile, cement was being mixed on the back table. When it was ready, the 31 x 6.2 mm patellar component was impacted into place and secured using the appropriate patellar clamp. This was left in place until the cemented hardened. Once the cement had hardened, the knee was placed through a range of motion with the findings as described above.    The wound was copiously irrigated with sterile saline solution using the jet lavage system before the quadriceps  tendon and retinacular layer were reapproximated using #0 Vicryl interrupted sutures. The superficial retinacular layer also was closed using a running #0 Vicryl suture. The subcutaneous tissues were closed in several layers using 2-0 Vicryl interrupted sutures. The skin was closed using staples. A sterile honeycomb dressing was applied to the skin before the leg was wrapped with an Ace wrap to accommodate the Polar Care device. The patient was then awakened and returned to the recovery room in satisfactory condition after tolerating the procedure well.

## 2023-07-17 NOTE — Anesthesia Procedure Notes (Addendum)
 Procedure Name: MAC Date/Time: 07/17/2023 10:35 AM  Performed by: Marisue Side, CRNAPre-anesthesia Checklist: Patient identified, Emergency Drugs available, Suction available and Patient being monitored Patient Re-evaluated:Patient Re-evaluated prior to induction Oxygen Delivery Method: Simple face mask Preoxygenation: Pre-oxygenation with 100% oxygen Induction Type: IV induction

## 2023-07-17 NOTE — Anesthesia Procedure Notes (Addendum)
 Spinal  Patient location during procedure: OR Start time: 07/17/2023 10:37 AM End time: 07/17/2023 10:42 AM Reason for block: surgical anesthesia Staffing Performed: resident/CRNA  Anesthesiologist: Lattie Poli, MD Resident/CRNA: Marisue Side, CRNA Performed by: Marisue Side, CRNA Authorized by: Lattie Poli, MD   Preanesthetic Checklist Completed: patient identified, IV checked, site marked, risks and benefits discussed, surgical consent, monitors and equipment checked, pre-op evaluation and timeout performed Spinal Block Patient position: sitting Prep: Betadine Patient monitoring: heart rate, continuous pulse ox, blood pressure and cardiac monitor Approach: midline Location: L4-5 Injection technique: single-shot Needle Needle type: Introducer and Pencan  Needle gauge: 24 G Needle length: 9 cm Assessment Events: CSF return Additional Notes Negative paresthesia. Negative blood return. Positive free-flowing CSF. Expiration date of kit checked and confirmed. Patient tolerated procedure well, without complications.

## 2023-07-17 NOTE — Anesthesia Preprocedure Evaluation (Addendum)
 Anesthesia Evaluation  Patient identified by MRN, date of birth, ID band Patient awake    Reviewed: Allergy & Precautions, NPO status , Patient's Chart, lab work & pertinent test results  History of Anesthesia Complications (+) PONV and history of anesthetic complications  Airway Mallampati: III  TM Distance: >3 FB Neck ROM: Full    Dental  (+) Upper Dentures   Pulmonary sleep apnea and Continuous Positive Airway Pressure Ventilation , neg COPD, Patient abstained from smoking.Not current smoker, former smoker   Pulmonary exam normal breath sounds clear to auscultation       Cardiovascular Exercise Tolerance: Good METShypertension, Pt. on medications (-) CAD and (-) Past MI (-) dysrhythmias  Rhythm:Regular Rate:Normal - Systolic murmurs 16/1096 stress echo: INTERPRETATION -----------------------------------------------------------------------  NORMAL STRESS ECHOCARDIOGRAM. NORMAL RESTING STUDY WITH NO WALL MOTION ABNORMALITIES AT REST AND PEAK EXERCISE.  Maximum workload of 3.4 METs was achieved during exercise.  RESTING COMMENTS ---------------------------------------------------------------------  DIASTOLIC FUNCTION NOT ASSESSED  NO DOPPLER PERFORMED FOR VALVULAR REGURGITATION  NO VALVULAR STENOSIS     Neuro/Psych  Headaches PSYCHIATRIC DISORDERS  Depression       GI/Hepatic PUD,GERD  Medicated,,(+)     (-) substance abuse  No symptoms of GERD today   Endo/Other  diabetes  Class 3 obesityLast taken GLP 1 agonist two weeks ago. Denies GI symptoms today  Renal/GU negative Renal ROS     Musculoskeletal  (+) Arthritis ,    Abdominal  (+) + obese  Peds  Hematology   Anesthesia Other Findings Past Medical History: No date: Ascending aorta dilatation (HCC) No date: Barrett's esophagus No date: Chronic dyspnea No date: Depression No date: DM (diabetes mellitus), type 2 (HCC) No date: GERD (gastroesophageal reflux  disease) No date: Heart murmur No date: Hyperlipidemia No date: Hypertension No date: LVH (left ventricular hypertrophy) due to hypertensive  disease, without heart failure No date: Migraines No date: Obesity No date: OSA on CPAP No date: Osteoarthritis of left knee No date: PONV (postoperative nausea and vomiting) No date: Stomach ulcer  Reproductive/Obstetrics                             Anesthesia Physical Anesthesia Plan  ASA: 3  Anesthesia Plan: Spinal   Post-op Pain Management:    Induction: Intravenous  PONV Risk Score and Plan: 3 and Ondansetron , Dexamethasone , Propofol  infusion, TIVA and Midazolam   Airway Management Planned: Natural Airway  Additional Equipment: None  Intra-op Plan:   Post-operative Plan:   Informed Consent: I have reviewed the patients History and Physical, chart, labs and discussed the procedure including the risks, benefits and alternatives for the proposed anesthesia with the patient or authorized representative who has indicated his/her understanding and acceptance.       Plan Discussed with: CRNA and Surgeon  Anesthesia Plan Comments: (Discussed R/B/A of neuraxial anesthesia technique with patient: - rare risks of spinal/epidural hematoma, nerve damage, infection - Risk of PDPH - Risk of nausea and vomiting - Risk of conversion to general anesthesia and its associated risks, including sore throat, damage to lips/eyes/teeth/oropharynx, and rare risks such as cardiac and respiratory events. - Risk of allergic reactions  Patient informed about increased incidence of above perioperative risk due to high BMI. Patient understands.    Discussed the role of CRNA in patient's perioperative care.  Patient voiced understanding.)       Anesthesia Quick Evaluation

## 2023-07-17 NOTE — Plan of Care (Signed)
 Patient making progress after surgery.

## 2023-07-17 NOTE — Evaluation (Signed)
 Physical Therapy Evaluation Patient Details Name: Cindy Singleton MRN: [id number] DOB: 1951/02/17 Today's Date: 07/17/2023  History of Present Illness  Pt is a 73 yo F diagnosed with degenerative joint disease of the left knee and is s/p elective L TKA.  PMH includes: R TKA, Barrett's esophagus, MDD, DM, GERD, and HTN.  Clinical Impression  Pt was pleasant and motivated to participate during the session and put forth good effort throughout. Pt required extra time and effort with bed mobility tasks but no physical assist.  Pt required cuing for sequencing and min A to come to standing from the EOB and once in standing presented with good stability.  Pt was able to take several steps near the EOB and from bed to chair with cuing for step-to sequencing with good stability and no L knee buckling.  Pt reported no adverse symptoms during the session other than L knee pain with SpO2 and HR WNL throughout on room air.  Pt will benefit from continued PT services upon discharge to safely address deficits listed in patient problem list for decreased caregiver assistance and eventual return to PLOF.            If plan is discharge home, recommend the following: A little help with walking and/or transfers;A little help with bathing/dressing/bathroom;Assistance with cooking/housework;Assist for transportation   Can travel by private vehicle        Equipment Recommendations None recommended by PT  Recommendations for Other Services       Functional Status Assessment Patient has had a recent decline in their functional status and demonstrates the ability to make significant improvements in function in a reasonable and predictable amount of time.     Precautions / Restrictions Precautions Precautions: Fall Restrictions Weight Bearing Restrictions Per Provider Order: Yes LLE Weight Bearing Per Provider Order: Weight bearing as tolerated      Mobility  Bed Mobility Overal bed mobility: Modified  Independent             General bed mobility comments: Extra time and effort only    Transfers Overall transfer level: Needs assistance Equipment used: Rolling walker (2 wheels) Transfers: Sit to/from Stand Sit to Stand: Min assist           General transfer comment: Min A and cues for sequencing to come to standing from the EOB; good eccentric control during stand to sit to the recliner with use of BUEs to assist    Ambulation/Gait Ambulation/Gait assistance: Contact guard assist Gait Distance (Feet): 6 Feet Assistive device: Rolling walker (2 wheels) Gait Pattern/deviations: Step-to pattern, Decreased step length - right, Decreased stance time - left Gait velocity: decreased     General Gait Details: Pt able to take several steps near the EOB and from bed to chair with CGA and cues for step-to sequencing with no ovet LOB or knee buckling  Stairs            Wheelchair Mobility     Tilt Bed    Modified Rankin (Stroke Patients Only)       Balance Overall balance assessment: Needs assistance   Sitting balance-Leahy Scale: Good     Standing balance support: Bilateral upper extremity supported, During functional activity Standing balance-Leahy Scale: Good                               Pertinent Vitals/Pain Pain Assessment Pain Assessment: 0-10 Pain Score: 6  Pain Location: L  knee Pain Descriptors / Indicators: Sore Pain Intervention(s): Repositioned, Ice applied, Premedicated before session, Monitored during session, Patient requesting pain meds-RN notified    Home Living Family/patient expects to be discharged to:: Private residence Living Arrangements: Alone Available Help at Discharge: Family;Available 24 hours/day Type of Home: House Home Access: Level entry       Home Layout: Two level;Able to live on main level with bedroom/bathroom Home Equipment: Rolling Walker (2 wheels);BSC/3in1;Cane - single point;Shower seat       Prior Function Prior Level of Function : Independent/Modified Independent             Mobility Comments: Ind amb without an AD in the home, SPC in the community limited community distances, no fall history ADLs Comments: Ind with ADLs     Extremity/Trunk Assessment   Upper Extremity Assessment Upper Extremity Assessment: Overall WFL for tasks assessed    Lower Extremity Assessment Lower Extremity Assessment: LLE deficits/detail LLE Deficits / Details: BLE ankle strength, AROM, and sensation to light touch grossly intact LLE: Unable to fully assess due to pain LLE Sensation: WNL LLE Coordination: WNL       Communication   Communication Communication: No apparent difficulties    Cognition Arousal: Alert Behavior During Therapy: WFL for tasks assessed/performed   PT - Cognitive impairments: No apparent impairments                         Following commands: Intact       Cueing Cueing Techniques: Verbal cues, Visual cues, Tactile cues     General Comments      Exercises Total Joint Exercises Ankle Circles/Pumps: AROM, Both, 10 reps, Strengthening Quad Sets: AROM, Strengthening, Left, 10 reps Straight Leg Raises: AROM, Strengthening, Left, 5 reps Long Arc Quad: AROM, Strengthening, Left, 10 reps Knee Flexion: AROM, Strengthening, Left, 10 reps Goniometric ROM: L knee AROM: 9-94 deg Marching in Standing: AROM, Strengthening, Both, 5 reps, Standing Other Exercises Other Exercises: HEP education for LLE QS and seated knee flex, HEP handout provided Other Exercises: LLE positioning education to promote L knee ext PROM   Assessment/Plan    PT Assessment Patient needs continued PT services  PT Problem List Decreased strength;Decreased range of motion;Decreased activity tolerance;Decreased balance;Decreased mobility;Decreased knowledge of use of DME;Pain       PT Treatment Interventions DME instruction;Gait training;Stair training;Functional mobility  training;Therapeutic activities;Therapeutic exercise;Balance training;Patient/family education    PT Goals (Current goals can be found in the Care Plan section)  Acute Rehab PT Goals Patient Stated Goal: To move without pain PT Goal Formulation: With patient Time For Goal Achievement: 07/30/23 Potential to Achieve Goals: Good    Frequency BID     Co-evaluation               AM-PAC PT "6 Clicks" Mobility  Outcome Measure Help needed turning from your back to your side while in a flat bed without using bedrails?: A Little Help needed moving from lying on your back to sitting on the side of a flat bed without using bedrails?: A Little Help needed moving to and from a bed to a chair (including a wheelchair)?: A Little Help needed standing up from a chair using your arms (e.g., wheelchair or bedside chair)?: A Little Help needed to walk in hospital room?: A Little Help needed climbing 3-5 steps with a railing? : A Lot 6 Click Score: 17    End of Session Equipment Utilized During Treatment: Gait belt Activity  Tolerance: Patient tolerated treatment well Patient left: in chair;with call bell/phone within reach;with nursing/sitter in room;with family/visitor present Nurse Communication: Mobility status;Weight bearing status PT Visit Diagnosis: Other abnormalities of gait and mobility (R26.89);Muscle weakness (generalized) (M62.81);Pain Pain - Right/Left: Left Pain - part of body: Knee    Time: 2130-8657 PT Time Calculation (min) (ACUTE ONLY): 29 min   Charges:   PT Evaluation $PT Eval Moderate Complexity: 1 Mod PT Treatments $Therapeutic Activity: 8-22 mins PT General Charges $$ ACUTE PT VISIT: 1 Visit    D. Madalyn Scarce PT, DPT 07/17/23, 4:55 PM

## 2023-07-17 NOTE — Transfer of Care (Signed)
 Immediate Anesthesia Transfer of Care Note  Patient: Cindy Singleton  Procedure(s) Performed: ARTHROPLASTY, KNEE, TOTAL (Left: Knee)  Patient Location: PACU  Anesthesia Type:MAC and Spinal  Level of Consciousness: drowsy and patient cooperative  Airway & Oxygen Therapy: Patient Spontanous Breathing and Patient connected to face mask oxygen  Post-op Assessment: Report given to RN and Post -op Vital signs reviewed and stable  Post vital signs: Reviewed and stable  Last Vitals:  Vitals Value Taken Time  BP 101/74 07/17/23 1254  Temp 36.9 C 07/17/23 1254  Pulse 68 07/17/23 1255  Resp 17 07/17/23 1255  SpO2 95 % 07/17/23 1255  Vitals shown include unfiled device data.  Last Pain:  Vitals:   07/17/23 1254  TempSrc:   PainSc: 0-No pain         Complications: No notable events documented.

## 2023-07-17 NOTE — Discharge Instructions (Addendum)
 Diet: As you were doing prior to hospitalization   Shower:  May shower but keep the wounds dry, use an occlusive plastic wrap, NO SOAKING IN TUB.  If the bandage gets wet, change with a clean dry gauze.  Dressing:  You may change your dressing as needed. Change the dressing with sterile gauze dressing.    Activity:  Increase activity slowly as tolerated, but follow the weight bearing instructions below.  No lifting or driving for 6 weeks.  Weight Bearing:   Weight bearing as tolerated to left lower extremity  To prevent constipation: you may use a stool softener such as -  Colace (over the counter) 100 mg by mouth twice a day  Drink plenty of fluids (prune juice may be helpful) and high fiber foods Miralax (over the counter) for constipation as needed.    Itching:  If you experience itching with your medications, try taking only a single pain pill, or even half a pain pill at a time.  You may take up to 10 pain pills per day, and you can also use benadryl  over the counter for itching or also to help with sleep.   Precautions:  If you experience chest pain or shortness of breath - call 911 immediately for transfer to the hospital emergency department!!  If you develop a fever greater that 101 F, purulent drainage from wound, increased redness or drainage from wound, or calf pain-Call [facility]                                              Follow- Up Appointment:  Please call for an appointment to be seen in 2 weeks at [facility] Agency: [employer].  [address] , [place], [place], [address]. [phone] They will call you to arrange when they can come to see you

## 2023-07-17 NOTE — Discharge Summary (Signed)
 Physician Discharge Summary  Patient ID: Cindy Singleton MRN: [id number] DOB/AGE: [birth year] 73 y.o.  Admit date: 07/17/2023 Discharge date: 07/18/2023  Admission Diagnoses:  Primary osteoarthritis of left knee [M17.12] Status post total knee replacement using cement, left [Z96.652]   Discharge Diagnoses: Patient Active Problem List   Diagnosis Date Noted   Status post total knee replacement using cement, left 07/17/2023   Status post total knee replacement using cement, right 12/13/2021    Past Medical History:  Diagnosis Date   Ascending aorta dilatation (HCC)    Barrett's esophagus    Chronic dyspnea    Depression    DM (diabetes mellitus), type 2 (HCC)    GERD (gastroesophageal reflux disease)    Heart murmur    Hyperlipidemia    Hypertension    LVH (left ventricular hypertrophy) due to hypertensive disease, without heart failure    Migraines    Obesity    OSA on CPAP    Osteoarthritis of left knee    PONV (postoperative nausea and vomiting)    Stomach ulcer      Transfusion: None.   Consultants (if any):   Discharged Condition: Improved  Hospital Course: Cindy Singleton is an 73 y.o. female who was admitted 07/17/2023 with a diagnosis of left knee degenerative joint disease and went to the operating room on 07/17/2023 and underwent the above named procedures.    Surgeries: Procedure(s): ARTHROPLASTY, KNEE, TOTAL on 07/17/2023 Patient tolerated the surgery well. Taken to PACU where she was stabilized and then transferred to the post op recovery area.  Started on Eliquis  2.5mg  every 12 hours. Heels elevated on bed with rolled towels. No evidence of DVT. Negative Homan. Physical therapy started on day #1 for gait training and transfer. OT started day #1 for ADL and assisted devices.  Patient's IV was removed on POD1.  Implants: Left TKA using all-pressfit Zimmer Persona system with a #7 PCR femur, a(n) E-sized  tibial tray with an 11 mm medial congruent E-poly  insert, and a 31 x 6.2 mm all-poly cemented Vanguard 3-pegged domed patella.   She was given perioperative antibiotics:  Anti-infectives (From admission, onward)    Start     Dose/Rate Route Frequency Ordered Stop   07/17/23 1700  ceFAZolin  (ANCEF ) IVPB 2g/100 mL premix  Status:  Discontinued        2 g 200 mL/hr over 30 Minutes Intravenous Every 6 hours 07/17/23 1523 07/17/23 1540   07/17/23 1700  ceFAZolin  (ANCEF ) IVPB 3g/150 mL premix        3 g 300 mL/hr over 30 Minutes Intravenous Every 6 hours 07/17/23 1540 07/17/23 2214   07/17/23 0600  ceFAZolin  (ANCEF ) IVPB 2g/100 mL premix        2 g 200 mL/hr over 30 Minutes Intravenous On call to O.R. 07/17/23 0216 07/17/23 1107     .  She was given sequential compression devices, early ambulation, and Eliquis  for DVT prophylaxis.  She benefited maximally from the hospital stay and there were no complications.    Recent vital signs:  Vitals:   07/17/23 2323 07/18/23 0509  BP: (!) 144/74 (!) 140/65  Pulse: 67 74  Resp: 16 16  Temp: (!) 97.4 F (36.3 C) 97.6 F (36.4 C)  SpO2: 96% 97%    Recent laboratory studies:  Lab Results  Component Value Date   HGB 9.8 (L) 07/18/2023   HGB 10.9 (L) 07/09/2023   HGB 10.0 (L) 12/14/2021   Lab Results  Component Value Date  WBC 13.7 (H) 07/18/2023   PLT 285 07/18/2023   No results found for: "INR" Lab Results  Component Value Date   NA 138 07/09/2023   K 3.9 07/09/2023   CL 105 07/09/2023   CO2 25 07/09/2023   BUN 21 07/09/2023   CREATININE 1.27 (H) 07/09/2023   GLUCOSE 119 (H) 07/09/2023    Discharge Medications:   Allergies as of 07/18/2023       Reactions   Shellfish-derived Products Anaphylaxis   Sulfa Antibiotics Hives, Itching, Rash   Darvon [propoxyphene] Other (See Comments)   Lisinopril Other (See Comments)   Severe headaches        Medication List     STOP taking these medications    aspirin EC 81 MG tablet       TAKE these medications     apixaban  2.5 MG Tabs tablet Commonly known as: ELIQUIS  Take 1 tablet (2.5 mg total) by mouth 2 (two) times daily.   atorvastatin  20 MG tablet Commonly known as: LIPITOR Take 20 mg by mouth every morning.   carvedilol  12.5 MG tablet Commonly known as: COREG  Take 12.5 mg by mouth 2 (two) times daily with a meal.   FLUoxetine  40 MG capsule Commonly known as: PROZAC  Take 40 mg by mouth every morning.   HYDROcodone-acetaminophen  5-325 MG tablet Commonly known as: NORCO/VICODIN Take 1-2 tablets by mouth every 6 (six) hours as needed for severe pain (pain score 7-10).   losartan  50 MG tablet Commonly known as: COZAAR  Take 50 mg by mouth every morning.   metFORMIN  500 MG tablet Commonly known as: GLUCOPHAGE  Take 1,000 mg by mouth 2 (two) times daily.   Mounjaro 5 MG/0.5ML Pen Generic drug: tirzepatide Inject 5 mg into the skin once a week. Tuesdays   omeprazole 40 MG capsule Commonly known as: PRILOSEC Take 40 mg by mouth every morning.   ondansetron  4 MG tablet Commonly known as: ZOFRAN  Take 1 tablet (4 mg total) by mouth every 6 (six) hours as needed for nausea.               Durable Medical Equipment  (From admission, onward)           Start     Ordered   07/17/23 1524  DME 3 n 1  Once        07/17/23 1523   07/17/23 1524  DME Walker rolling  Once       Question Answer Comment  Walker: With 5 Inch Wheels   Patient needs a walker to treat with the following condition Status post total knee replacement using cement, left      07/17/23 1523            Diagnostic Studies: DG Knee Left Port Result Date: 07/17/2023 CLINICAL DATA:  Status post total knee replacement. EXAM: PORTABLE LEFT KNEE - 1-2 VIEW COMPARISON:  None Available. FINDINGS: Left knee arthroplasty in expected alignment. No periprosthetic lucency or fracture. There has been patellar resurfacing. Recent postsurgical change includes air and edema in the soft tissues and joint space.  Anterior skin staples in place. IMPRESSION: Left knee arthroplasty without immediate postoperative complication. Electronically Signed   By: Chadwick Colonel M.D.   On: 07/17/2023 15:33   Disposition: Plan for discharge home today pending progress with PT.    Follow-up Information     Rojelio Clement, PA-C Follow up in 14 day(s).   Specialty: Physician Assistant Why: Marlinda [facility] [employer]. Contact information: [address] [place] [address]  [phone]                Signed: Juanell Nora PA-C 07/18/2023, 7:19 AM

## 2023-07-18 ENCOUNTER — Encounter: Payer: Self-pay | Admitting: Surgery

## 2023-07-18 DIAGNOSIS — M1712 Unilateral primary osteoarthritis, left knee: Secondary | ICD-10-CM | POA: Diagnosis not present

## 2023-07-18 LAB — BASIC METABOLIC PANEL WITH GFR
Anion gap: 11 (ref 5–15)
BUN: 29 mg/dL — ABNORMAL HIGH (ref 8–23)
CO2: 18 mmol/L — ABNORMAL LOW (ref 22–32)
Calcium: 8.7 mg/dL — ABNORMAL LOW (ref 8.9–10.3)
Chloride: 103 mmol/L (ref 98–111)
Creatinine, Ser: 1.44 mg/dL — ABNORMAL HIGH (ref 0.44–1.00)
GFR, Estimated: 38 mL/min — ABNORMAL LOW (ref >60)
Glucose, Bld: 249 mg/dL — ABNORMAL HIGH (ref 70–99)
Potassium: 3.9 mmol/L (ref 3.5–5.1)
Sodium: 132 mmol/L — ABNORMAL LOW (ref 135–145)

## 2023-07-18 LAB — CBC
HCT: 29.8 % — ABNORMAL LOW (ref 36.0–46.0)
Hemoglobin: 9.8 g/dL — ABNORMAL LOW (ref 12.0–15.0)
MCH: 28.1 pg (ref 26.0–34.0)
MCHC: 32.9 g/dL (ref 30.0–36.0)
MCV: 85.4 fL (ref 80.0–100.0)
Platelets: 285 10*3/uL (ref 150–400)
RBC: 3.49 MIL/uL — ABNORMAL LOW (ref 3.87–5.11)
RDW: 14.1 % (ref 11.5–15.5)
WBC: 13.7 10*3/uL — ABNORMAL HIGH (ref 4.0–10.5)
nRBC: 0 % (ref 0.0–0.2)

## 2023-07-18 LAB — GLUCOSE, CAPILLARY: Glucose-Capillary: 201 mg/dL — ABNORMAL HIGH (ref 70–99)

## 2023-07-18 MED ORDER — PANTOPRAZOLE SODIUM 40 MG PO TBEC
DELAYED_RELEASE_TABLET | ORAL | Status: AC
  Filled 2023-07-18: qty 1

## 2023-07-18 MED ORDER — APIXABAN 2.5 MG PO TABS
2.5000 mg | ORAL_TABLET | Freq: Two times a day (BID) | ORAL | 0 refills | Status: AC
Start: 2023-07-18 — End: ?

## 2023-07-18 MED ORDER — APIXABAN 2.5 MG PO TABS
ORAL_TABLET | ORAL | Status: AC
Start: 2023-07-18 — End: ?
  Filled 2023-07-18: qty 1

## 2023-07-18 MED ORDER — METFORMIN HCL 500 MG PO TABS
ORAL_TABLET | ORAL | Status: AC
  Filled 2023-07-18: qty 2

## 2023-07-18 MED ORDER — HYDROCODONE-ACETAMINOPHEN 5-325 MG PO TABS: 1.0000 | ORAL_TABLET | Freq: Four times a day (QID) | ORAL | Status: DC | PRN

## 2023-07-18 MED ORDER — HYDROCODONE-ACETAMINOPHEN 5-325 MG PO TABS: 1.0000 | ORAL_TABLET | Freq: Four times a day (QID) | ORAL | 0 refills | Status: AC | PRN

## 2023-07-18 MED ORDER — ONDANSETRON HCL 4 MG PO TABS: 4.0000 mg | ORAL_TABLET | Freq: Four times a day (QID) | ORAL | 0 refills | Status: AC | PRN

## 2023-07-18 NOTE — Plan of Care (Signed)
  Problem: Pain Managment: Goal: General experience of comfort will improve and/or be controlled Outcome: Progressing   Problem: Safety: Goal: Ability to remain free from injury will improve Outcome: Progressing   Problem: Skin Integrity: Goal: Risk for impaired skin integrity will decrease Outcome: Progressing

## 2023-07-18 NOTE — Progress Notes (Signed)
 Physical Therapy Treatment Patient Details Name: Cindy Singleton MRN: [id number] DOB: Feb 04, 1951 Today's Date: 07/18/2023   History of Present Illness Pt is a 73 yo F diagnosed with degenerative joint disease of the left knee and is s/p elective L TKA.  PMH includes: R TKA, Barrett's esophagus, MDD, DM, GERD, and HTN.    PT Comments  Pt was long sitting in bed pre/post session. She tolerated session well and is cleared from an acute PT standpoint for safe DC home. Pt will benefit from post acute PT to maximize her independence and safety with all ADLs.    If plan is discharge home, recommend the following: A little help with walking and/or transfers;A little help with bathing/dressing/bathroom;Assistance with cooking/housework;Assist for transportation     Equipment Recommendations  None recommended by PT       Precautions / Restrictions Precautions Precautions: Fall Restrictions Weight Bearing Restrictions Per Provider Order: Yes LLE Weight Bearing Per Provider Order: Weight bearing as tolerated     Mobility  Bed Mobility Overal bed mobility: Modified Independent   Transfers Overall transfer level: Needs assistance Equipment used: Rolling walker (2 wheels) Transfers: Sit to/from Stand Sit to Stand: Supervision   Ambulation/Gait Ambulation/Gait assistance: Supervision Gait Distance (Feet): 200 Feet Assistive device: Rolling walker (2 wheels) Gait Pattern/deviations: Step-through pattern, Antalgic Gait velocity: decreased  General Gait Details: no LOB or safety concerns with ambulation   Stairs Stairs: Yes Stairs assistance: Supervision Stair Management: One rail Left, Step to pattern, Sideways Number of Stairs: 4     Balance Overall balance assessment: Needs assistance   Sitting balance-Leahy Scale: Good     Standing balance support: Bilateral upper extremity supported, During functional activity, Reliant on assistive device for balance Standing balance-Leahy  Scale: Good      Communication Communication Communication: No apparent difficulties  Cognition Arousal: Alert Behavior During Therapy: WFL for tasks assessed/performed   PT - Cognitive impairments: No apparent impairments   Following commands: Intact      Cueing Cueing Techniques: Verbal cues, Visual cues, Tactile cues  Exercises Total Joint Exercises Ankle Circles/Pumps: AROM, Both, 10 reps, Strengthening Quad Sets: AROM, Strengthening, Left, 10 reps Goniometric ROM: 3-84        Pertinent Vitals/Pain Pain Assessment Pain Assessment: 0-10 Pain Score: 4  Pain Location: L knee Pain Descriptors / Indicators: Sore Pain Intervention(s): Limited activity within patient's tolerance, Monitored during session, Premedicated before session, Repositioned, Ice applied     PT Goals (current goals can now be found in the care plan section) Acute Rehab PT Goals Patient Stated Goal: go home Progress towards PT goals: Progressing toward goals    Frequency    BID       AM-PAC PT "6 Clicks" Mobility   Outcome Measure  Help needed turning from your back to your side while in a flat bed without using bedrails?: A Little Help needed moving from lying on your back to sitting on the side of a flat bed without using bedrails?: A Little Help needed moving to and from a bed to a chair (including a wheelchair)?: A Little Help needed standing up from a chair using your arms (e.g., wheelchair or bedside chair)?: A Little Help needed to walk in hospital room?: A Little Help needed climbing 3-5 steps with a railing? : A Little 6 Click Score: 18    End of Session   Activity Tolerance: Patient tolerated treatment well Patient left: in bed;with call bell/phone within reach;with bed alarm set Nurse Communication: Mobility status;Weight  bearing status PT Visit Diagnosis: Other abnormalities of gait and mobility (R26.89);Muscle weakness (generalized) (M62.81);Pain Pain - Right/Left:  Left Pain - part of body: Knee     Time: 9147-8295 PT Time Calculation (min) (ACUTE ONLY): 17 min  Charges:    $Gait Training: 8-22 mins PT General Charges $$ ACUTE PT VISIT: 1 Visit                     Chester Costa PTA 07/18/23, 11:09 AM

## 2023-07-18 NOTE — Anesthesia Postprocedure Evaluation (Signed)
 Anesthesia Post Note  Patient: Cindy Singleton  Procedure(s) Performed: ARTHROPLASTY, KNEE, TOTAL (Left: Knee)  Patient location during evaluation: Nursing Unit Anesthesia Type: Spinal Level of consciousness: oriented and awake and alert Pain management: pain level controlled Vital Signs Assessment: post-procedure vital signs reviewed and stable Respiratory status: spontaneous breathing and respiratory function stable Cardiovascular status: blood pressure returned to baseline and stable Postop Assessment: no headache, no backache, no apparent nausea or vomiting and patient able to bend at knees Anesthetic complications: no   No notable events documented.   Last Vitals:  Vitals:   07/17/23 2323 07/18/23 0509  BP: (!) 144/74 (!) 140/65  Pulse: 67 74  Resp: 16 16  Temp: (!) 36.3 C 36.4 C  SpO2: 96% 97%    Last Pain:  Vitals:   07/18/23 0509  TempSrc: Temporal  PainSc:                  Buell Carmin

## 2023-07-18 NOTE — Plan of Care (Signed)
  Problem: Activity: Goal: Risk for activity intolerance will decrease Outcome: Progressing   Problem: Elimination: Goal: Will not experience complications related to urinary retention Outcome: Progressing   

## 2023-07-18 NOTE — TOC Transition Note (Signed)
 Transition of Care [facility]) - Discharge Note   Patient Details  Name: Cindy Singleton MRN: [id number] Date of Birth: April 30, 1950  Transition of Care [facility]) CM/SW Contact:  Alexandra Ice, RN Phone Number: 07/18/2023, 7:56 AM   Clinical Narrative:    Patient to discharge home with home health services. She has PCP. [facility] set up by surgeon's office prior to surgery. Home health agency information added to AVS.    Final next level of care: Home w Home Health Services Barriers to Discharge: Barriers Resolved   Patient Goals and CMS Choice Patient states their goals for this hospitalization and ongoing recovery are:: get better          Discharge Placement                  Name of family member notified: Daughter: Renny Casey Patient and family notified of of transfer: 07/18/23  Discharge Plan and Services Additional resources added to the After Visit Summary for                    DME Agency: NA       HH Arranged: PT, OT HH Agency: [facility] Date [facility] Agency Contacted: 07/18/23 Time HH Agency Contacted: [id number] Representative spoke with at [facility] Agency: Loetta Ringer  Social Drivers of Health (SDOH) Interventions SDOH Screenings   Food Insecurity: No Food Insecurity (07/17/2023)  Housing: Low Risk  (07/17/2023)  Transportation Needs: No Transportation Needs (07/17/2023)  Utilities: Not At Risk (07/17/2023)  Financial Resource Strain: Low Risk  (03/27/2023)   Received from [facility]  Social Connections: Moderately Isolated (07/17/2023)  Tobacco Use: Medium Risk (07/17/2023)     Readmission Risk Interventions     No data to display

## 2023-07-18 NOTE — Progress Notes (Signed)
  Subjective: 1 Day Post-Op Procedure(s) (LRB): ARTHROPLASTY, KNEE, TOTAL (Left) Patient reports pain as mild.   Patient is well, and has had no acute complaints or problems Plan is to go Home after hospital stay. Negative for chest pain and shortness of breath Fever: no Gastrointestinal:Negative for nausea and vomiting Reports she is passing gas this morningl  Objective: Vital signs in last 24 hours: Temp:  [97.1 F (36.2 C)-98.4 F (36.9 C)] 97.6 F (36.4 C) (05/21 0509) Pulse Rate:  [62-113] 74 (05/21 0509) Resp:  [11-20] 16 (05/21 0509) BP: (96-144)/(50-95) 140/65 (05/21 0509) SpO2:  [92 %-100 %] 97 % (05/21 0509) Weight:  [111.5 kg] 111.5 kg (05/20 0902)  Intake/Output from previous day:  Intake/Output Summary (Last 24 hours) at 07/18/2023 0714 Last data filed at 07/17/2023 1722 Gross per 24 hour  Intake 1421.43 ml  Output 315 ml  Net 1106.43 ml    Intake/Output this shift: No intake/output data recorded.  Labs: Recent Labs    07/18/23 0621  HGB 9.8*   Recent Labs    07/18/23 0621  WBC 13.7*  RBC 3.49*  HCT 29.8*  PLT 285   No results for input(s): "NA", "K", "CL", "CO2", "BUN", "CREATININE", "GLUCOSE", "CALCIUM " in the last 72 hours. No results for input(s): "LABPT", "INR" in the last 72 hours.   EXAM General - Patient is Alert, Appropriate, and Oriented Extremity - ABD soft Neurovascular intact Dorsiflexion/Plantar flexion intact Incision: scant drainage No cellulitis present Dressing/Incision - Minimally bloody drainage noted to the left knee honeycomb this AM. Motor Function - intact, moving foot and toes well on exam.  Abdomen soft with intact bowel sounds.  Past Medical History:  Diagnosis Date   Ascending aorta dilatation (HCC)    Barrett's esophagus    Chronic dyspnea    Depression    DM (diabetes mellitus), type 2 (HCC)    GERD (gastroesophageal reflux disease)    Heart murmur    Hyperlipidemia    Hypertension    LVH (left  ventricular hypertrophy) due to hypertensive disease, without heart failure    Migraines    Obesity    OSA on CPAP    Osteoarthritis of left knee    PONV (postoperative nausea and vomiting)    Stomach ulcer     Assessment/Plan: 1 Day Post-Op Procedure(s) (LRB): ARTHROPLASTY, KNEE, TOTAL (Left) Principal Problem:   Status post total knee replacement using cement, left  Estimated body mass index is 46.46 kg/m as calculated from the following:   Height as of this encounter: 5\' 1"  (1.549 m).   Weight as of this encounter: 111.5 kg. Advance diet Up with therapy D/C IV fluids when tolerating po intake.  Labs and vitals reviewed this AM. Hg 9.8 this AM.  No recent fever. Reports she is passing gas this morning. States she had a lot of N/V with oxy after last surgery, will switch to Norco at discharge. Up with PT today.  Plan for d/c home today pending progress with PT.  DVT Prophylaxis - TED hose and Eliquis  Weight-Bearing as tolerated to left leg  J. Edie Goon, PA-C [facility] 07/18/2023, 7:14 AM

## 2023-07-18 NOTE — Progress Notes (Signed)
 DISCHARGE NOTE:  Pt given discharge instructions, scripts and 2 honeycomb dressings. TED hose on both legs. Pt wheeled to car by staff, daughter providing transportation home.

## 2023-09-10 ENCOUNTER — Other Ambulatory Visit: Payer: Self-pay | Admitting: Physician Assistant

## 2023-09-10 DIAGNOSIS — Z1231 Encounter for screening mammogram for malignant neoplasm of breast: Secondary | ICD-10-CM

## 2023-10-15 ENCOUNTER — Ambulatory Visit
Admission: RE | Admit: 2023-10-15 | Discharge: 2023-10-15 | Disposition: A | Discharge status: Home / Self Care | Source: Ambulatory Visit | Attending: Physician Assistant | Admitting: Physician Assistant

## 2023-10-15 DIAGNOSIS — Z1231 Encounter for screening mammogram for malignant neoplasm of breast: Secondary | ICD-10-CM | POA: Insufficient documentation

## 2024-01-21 ENCOUNTER — Encounter (INDEPENDENT_AMBULATORY_CARE_PROVIDER_SITE_OTHER): Payer: Self-pay

## 2024-01-22 ENCOUNTER — Other Ambulatory Visit: Payer: Self-pay | Admitting: Medical Genetics

## 2024-01-26 ENCOUNTER — Other Ambulatory Visit: Payer: Self-pay

## 2024-01-29 ENCOUNTER — Other Ambulatory Visit

## 2024-02-04 ENCOUNTER — Other Ambulatory Visit

## 2024-02-05 ENCOUNTER — Inpatient Hospital Stay
Admission: RE | Admit: 2024-02-05 | Discharge: 2024-02-05 | Payer: Self-pay | Attending: Medical Genetics | Admitting: Medical Genetics

## 2024-02-17 LAB — GENECONNECT MOLECULAR SCREEN: Genetic Analysis Overall Interpretation: NEGATIVE

## 2024-05-31 IMAGING — MG MM DIGITAL SCREENING BILAT W/ TOMO AND CAD
8 series · 8 of 24 positions shown · non-contrast
Comparison: Previous exam(s).

CLINICAL DATA: Screening.

EXAM:
DIGITAL SCREENING BILATERAL MAMMOGRAM WITH TOMOSYNTHESIS AND CAD
TECHNIQUE: Bilateral screening digital craniocaudal and mediolateral oblique
mammograms were obtained. Bilateral screening digital breast
tomosynthesis was performed. The images were evaluated with
computer-aided detection.

[R CC synth-2D]
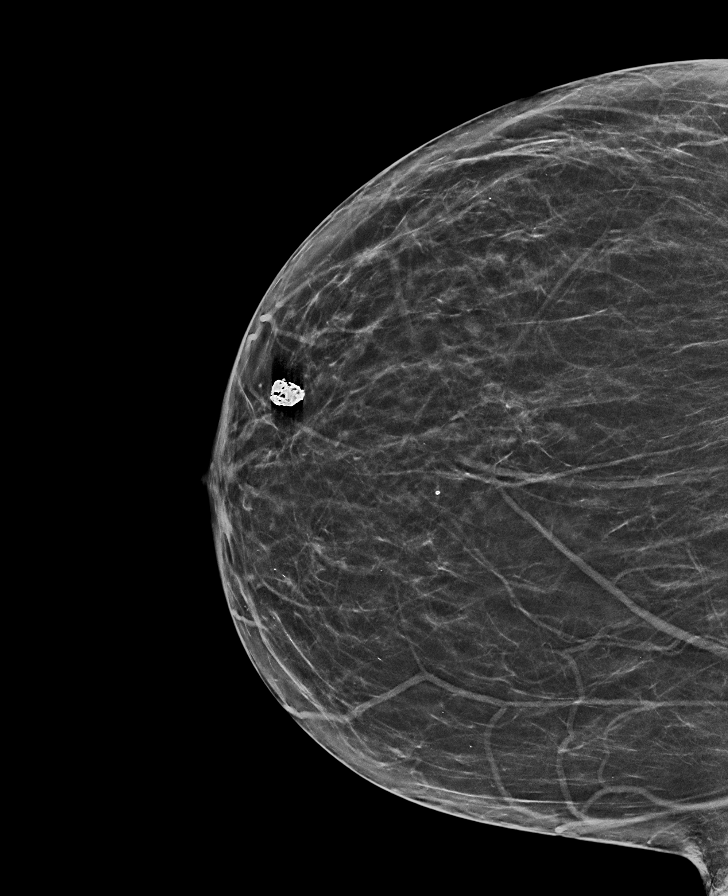

[L MLO synth-2D]
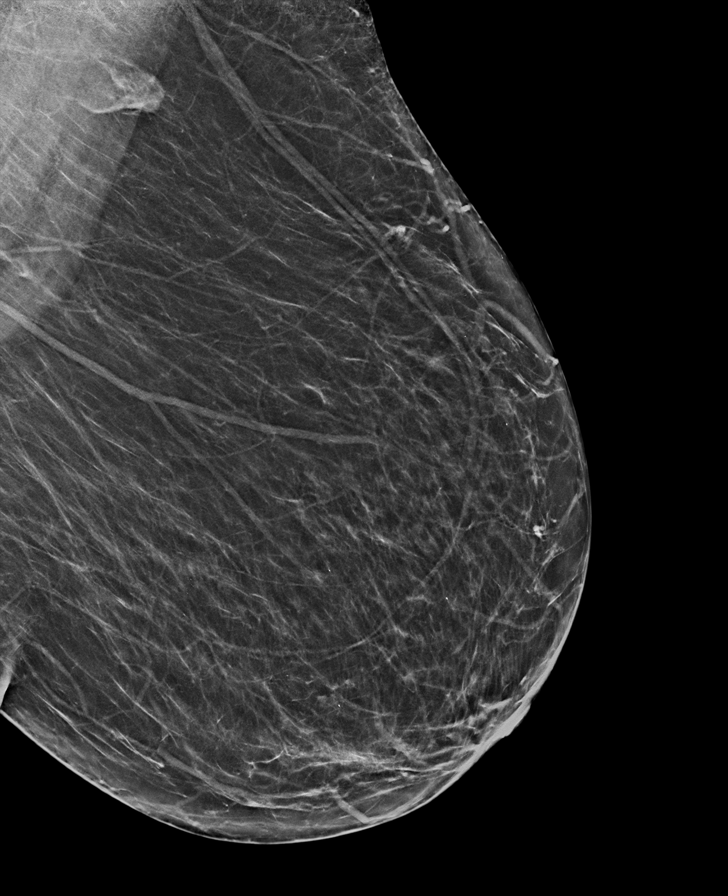

[R MLO synth-2D]
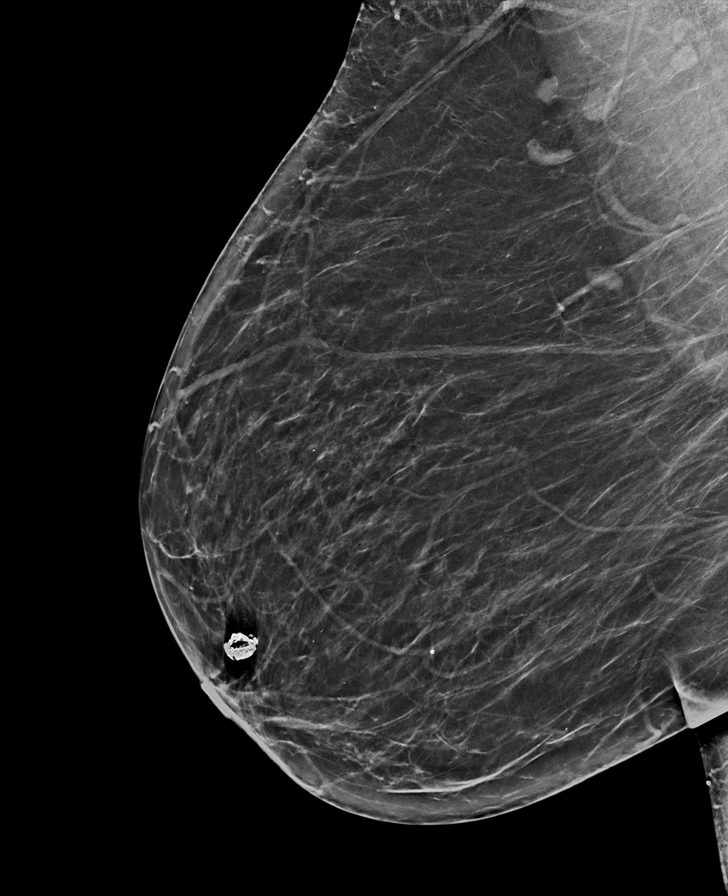

[L CC synth-2D]
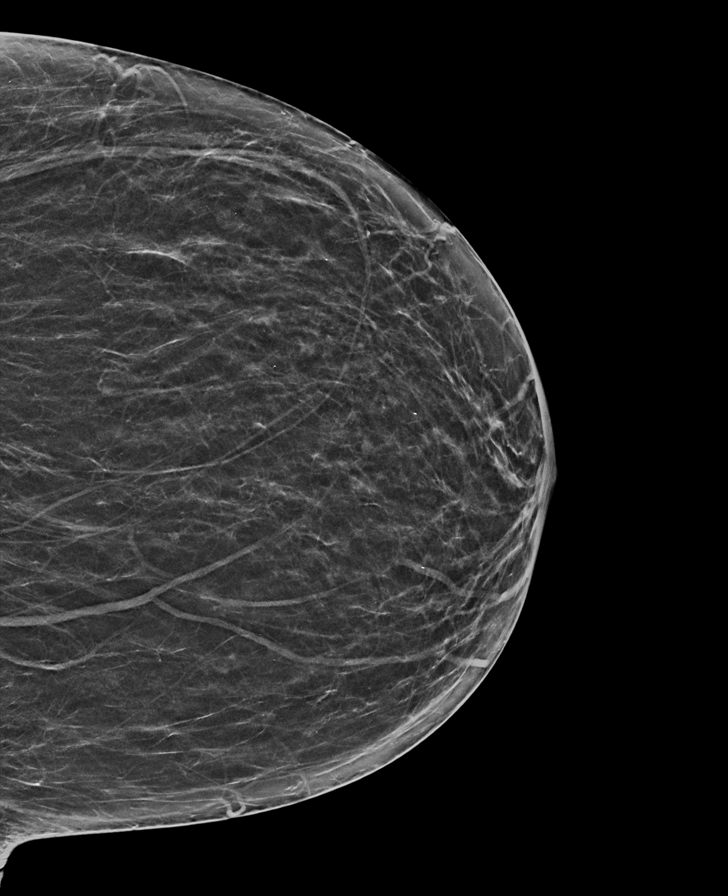

[L MLO tomo · tomo slice 33/66.0]
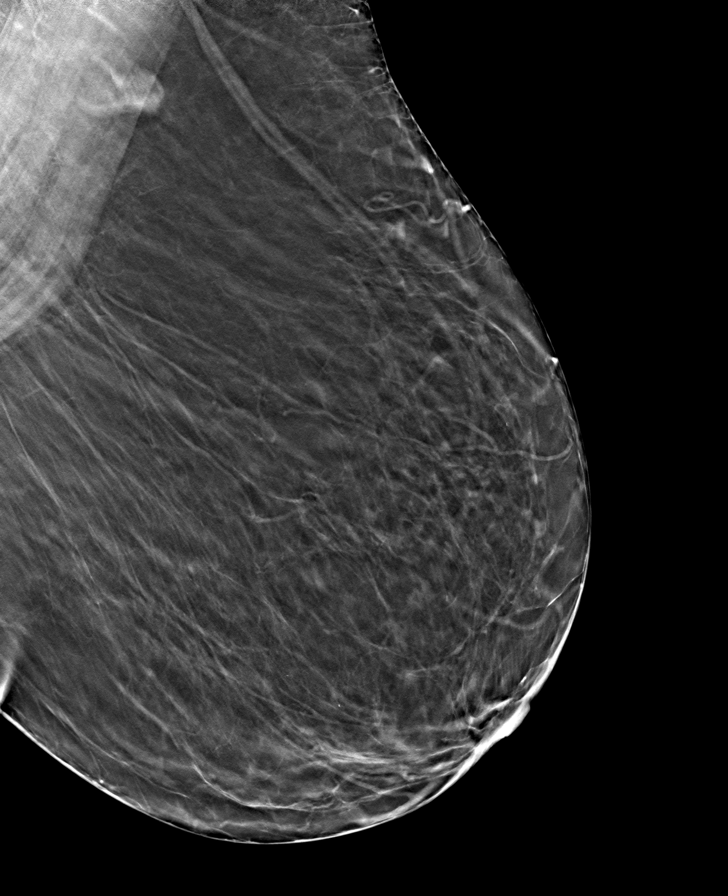

[L CC tomo · tomo slice 29/57.0]
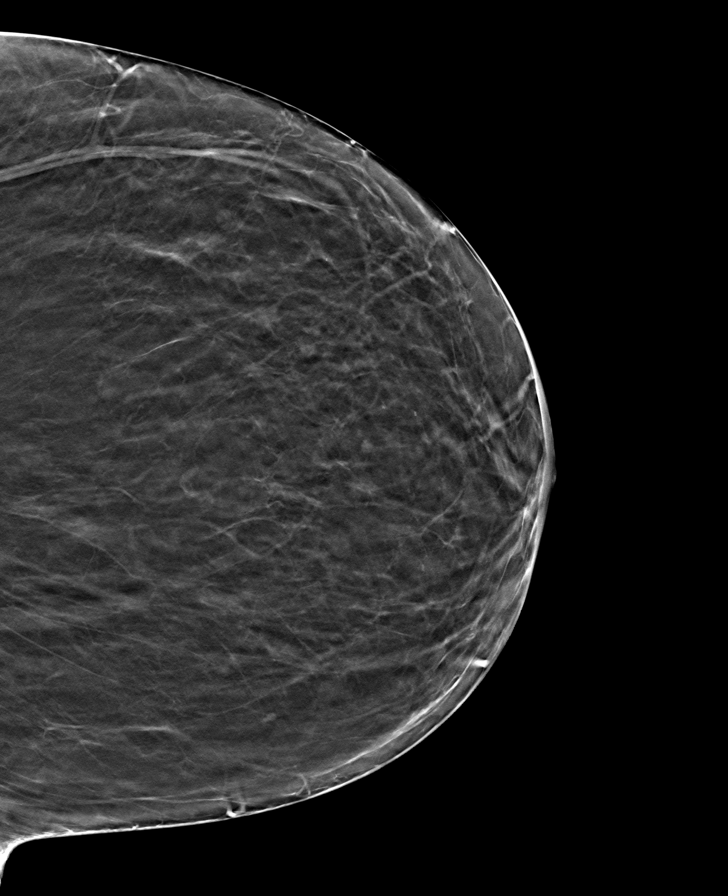

[R MLO tomo · tomo slice 37/72.0]
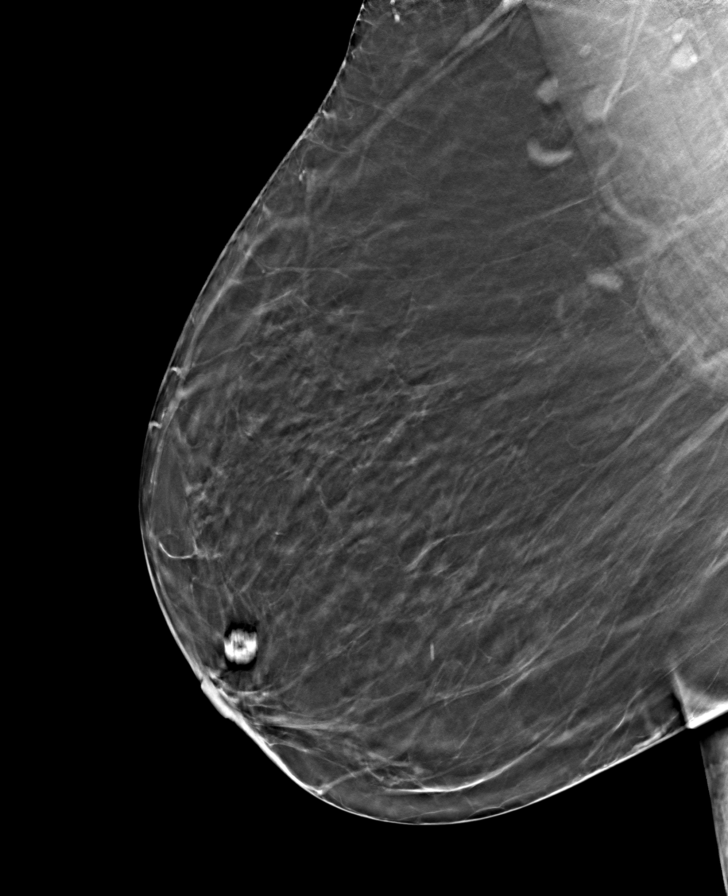

[R CC tomo · tomo slice 31/62.0]
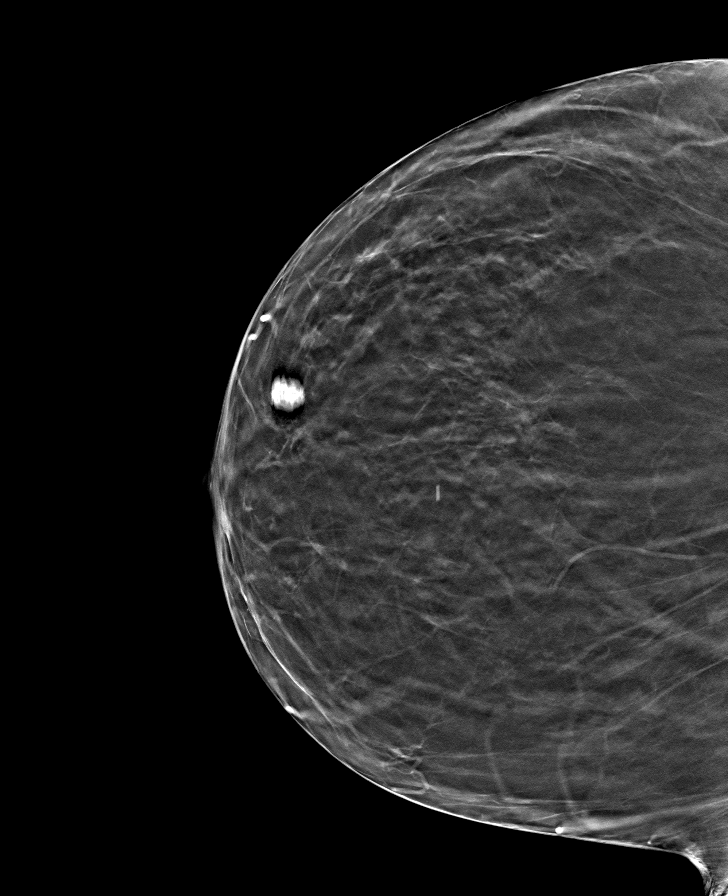

[8 of 24 positions shown; findings below may reference images not displayed]

ACR Breast Density Category b: There are scattered areas of
fibroglandular density.
FINDINGS: There are no findings suspicious for malignancy.
IMPRESSION: No mammographic evidence of malignancy. A result letter of this
screening mammogram will be mailed directly to the patient.

RECOMMENDATION:
Screening mammogram in one year. (Code:51-O-LD2)

BI-RADS CATEGORY  1: Negative.
